# Patient Record
Sex: Female | Born: 1960 | Race: White | Hispanic: No | Marital: Married | Smoking: Never smoker
Health system: Southern US, Community
[De-identification: ages and names within clinical notes are randomized; demographics above are authoritative.]

## PROBLEM LIST (undated history)

## (undated) DIAGNOSIS — M2341 Loose body in knee, right knee: Secondary | ICD-10-CM

## (undated) DIAGNOSIS — R102 Pelvic and perineal pain: Secondary | ICD-10-CM

## (undated) DIAGNOSIS — Z973 Presence of spectacles and contact lenses: Secondary | ICD-10-CM

## (undated) DIAGNOSIS — Z8742 Personal history of other diseases of the female genital tract: Secondary | ICD-10-CM

## (undated) DIAGNOSIS — Q524 Other congenital malformations of vagina: Secondary | ICD-10-CM

## (undated) DIAGNOSIS — M1711 Unilateral primary osteoarthritis, right knee: Secondary | ICD-10-CM

## (undated) DIAGNOSIS — N3941 Urge incontinence: Secondary | ICD-10-CM

## (undated) HISTORY — DX: Other congenital malformations of vagina: Q52.4

## (undated) HISTORY — DX: Pelvic and perineal pain: R10.2

## (undated) HISTORY — PX: TUBAL LIGATION: SHX77

## (undated) HISTORY — PX: COLONOSCOPY: SHX174

## (undated) HISTORY — DX: Urge incontinence: N39.41

## (undated) HISTORY — DX: Personal history of other diseases of the female genital tract: Z87.42

## (undated) HISTORY — PX: CARPAL TUNNEL RELEASE: SHX101

## (undated) HISTORY — PX: LAPAROSCOPY: SHX197

---

## 1997-11-01 ENCOUNTER — Ambulatory Visit (HOSPITAL_COMMUNITY): Admission: RE | Admit: 1997-11-01 | Discharge: 1997-11-01 | Payer: Self-pay | Admitting: Obstetrics and Gynecology

## 1998-10-19 ENCOUNTER — Encounter: Payer: Self-pay | Admitting: Obstetrics and Gynecology

## 1998-10-19 ENCOUNTER — Ambulatory Visit (HOSPITAL_COMMUNITY): Admission: RE | Admit: 1998-10-19 | Discharge: 1998-10-19 | Payer: Self-pay | Admitting: Obstetrics and Gynecology

## 1999-10-31 ENCOUNTER — Encounter: Payer: Self-pay | Admitting: Obstetrics and Gynecology

## 1999-10-31 ENCOUNTER — Ambulatory Visit (HOSPITAL_COMMUNITY): Admission: RE | Admit: 1999-10-31 | Discharge: 1999-10-31 | Payer: Self-pay | Admitting: Obstetrics and Gynecology

## 2000-09-18 ENCOUNTER — Other Ambulatory Visit: Admission: RE | Admit: 2000-09-18 | Discharge: 2000-09-18 | Payer: Self-pay | Admitting: Obstetrics and Gynecology

## 2000-11-01 ENCOUNTER — Encounter: Payer: Self-pay | Admitting: Obstetrics and Gynecology

## 2000-11-01 ENCOUNTER — Ambulatory Visit (HOSPITAL_COMMUNITY): Admission: RE | Admit: 2000-11-01 | Discharge: 2000-11-01 | Payer: Self-pay | Admitting: Obstetrics and Gynecology

## 2001-01-06 ENCOUNTER — Other Ambulatory Visit: Admission: RE | Admit: 2001-01-06 | Discharge: 2001-01-06 | Payer: Self-pay | Admitting: Obstetrics and Gynecology

## 2001-03-13 ENCOUNTER — Encounter: Admission: RE | Admit: 2001-03-13 | Discharge: 2001-03-13 | Payer: Self-pay | Admitting: Emergency Medicine

## 2001-03-13 ENCOUNTER — Encounter: Payer: Self-pay | Admitting: Emergency Medicine

## 2001-09-17 HISTORY — PX: ABDOMINAL HYSTERECTOMY: SHX81

## 2001-11-27 ENCOUNTER — Ambulatory Visit (HOSPITAL_COMMUNITY): Admission: RE | Admit: 2001-11-27 | Discharge: 2001-11-27 | Payer: Self-pay | Admitting: Obstetrics and Gynecology

## 2001-11-27 ENCOUNTER — Encounter: Payer: Self-pay | Admitting: Obstetrics and Gynecology

## 2001-12-02 ENCOUNTER — Encounter: Admission: RE | Admit: 2001-12-02 | Discharge: 2001-12-02 | Payer: Self-pay | Admitting: Obstetrics and Gynecology

## 2001-12-02 ENCOUNTER — Encounter: Payer: Self-pay | Admitting: Obstetrics and Gynecology

## 2001-12-22 ENCOUNTER — Other Ambulatory Visit: Admission: RE | Admit: 2001-12-22 | Discharge: 2001-12-22 | Payer: Self-pay | Admitting: Obstetrics and Gynecology

## 2002-02-03 ENCOUNTER — Encounter (INDEPENDENT_AMBULATORY_CARE_PROVIDER_SITE_OTHER): Payer: Self-pay

## 2002-02-03 ENCOUNTER — Observation Stay (HOSPITAL_COMMUNITY): Admission: RE | Admit: 2002-02-03 | Discharge: 2002-02-04 | Payer: Self-pay | Admitting: Obstetrics and Gynecology

## 2002-05-21 ENCOUNTER — Encounter: Payer: Self-pay | Admitting: Obstetrics and Gynecology

## 2002-05-21 ENCOUNTER — Encounter: Admission: RE | Admit: 2002-05-21 | Discharge: 2002-05-21 | Payer: Self-pay | Admitting: Obstetrics and Gynecology

## 2002-09-17 HISTORY — PX: MELANOMA EXCISION: SHX5266

## 2003-01-06 ENCOUNTER — Encounter: Admission: RE | Admit: 2003-01-06 | Discharge: 2003-01-06 | Payer: Self-pay | Admitting: Obstetrics and Gynecology

## 2003-01-06 ENCOUNTER — Other Ambulatory Visit: Admission: RE | Admit: 2003-01-06 | Discharge: 2003-01-06 | Payer: Self-pay | Admitting: Obstetrics and Gynecology

## 2003-01-06 ENCOUNTER — Encounter: Payer: Self-pay | Admitting: Obstetrics and Gynecology

## 2003-06-15 ENCOUNTER — Encounter (HOSPITAL_BASED_OUTPATIENT_CLINIC_OR_DEPARTMENT_OTHER): Payer: Self-pay | Admitting: General Surgery

## 2003-06-16 ENCOUNTER — Ambulatory Visit (HOSPITAL_COMMUNITY): Admission: RE | Admit: 2003-06-16 | Discharge: 2003-06-16 | Payer: Self-pay | Admitting: General Surgery

## 2003-06-16 ENCOUNTER — Encounter (INDEPENDENT_AMBULATORY_CARE_PROVIDER_SITE_OTHER): Payer: Self-pay | Admitting: *Deleted

## 2004-01-10 ENCOUNTER — Other Ambulatory Visit: Admission: RE | Admit: 2004-01-10 | Discharge: 2004-01-10 | Payer: Self-pay | Admitting: Obstetrics and Gynecology

## 2004-01-10 ENCOUNTER — Encounter: Admission: RE | Admit: 2004-01-10 | Discharge: 2004-01-10 | Payer: Self-pay | Admitting: Obstetrics and Gynecology

## 2004-05-19 ENCOUNTER — Encounter: Admission: RE | Admit: 2004-05-19 | Discharge: 2004-05-19 | Payer: Self-pay | Admitting: Emergency Medicine

## 2004-11-28 ENCOUNTER — Other Ambulatory Visit: Admission: RE | Admit: 2004-11-28 | Discharge: 2004-11-28 | Payer: Self-pay | Admitting: Obstetrics and Gynecology

## 2005-01-10 ENCOUNTER — Ambulatory Visit (HOSPITAL_COMMUNITY): Admission: RE | Admit: 2005-01-10 | Discharge: 2005-01-10 | Payer: Self-pay | Admitting: Obstetrics and Gynecology

## 2005-09-17 HISTORY — PX: OTHER SURGICAL HISTORY: SHX169

## 2006-01-23 ENCOUNTER — Ambulatory Visit (HOSPITAL_COMMUNITY): Admission: RE | Admit: 2006-01-23 | Discharge: 2006-01-23 | Payer: Self-pay | Admitting: Obstetrics and Gynecology

## 2006-05-01 ENCOUNTER — Other Ambulatory Visit: Admission: RE | Admit: 2006-05-01 | Discharge: 2006-05-01 | Payer: Self-pay | Admitting: Obstetrics and Gynecology

## 2007-03-17 ENCOUNTER — Ambulatory Visit (HOSPITAL_COMMUNITY): Admission: RE | Admit: 2007-03-17 | Discharge: 2007-03-17 | Payer: Self-pay | Admitting: Obstetrics and Gynecology

## 2008-02-19 ENCOUNTER — Emergency Department (HOSPITAL_COMMUNITY): Admission: EM | Admit: 2008-02-19 | Discharge: 2008-02-19 | Payer: Self-pay | Admitting: Emergency Medicine

## 2008-05-03 ENCOUNTER — Ambulatory Visit (HOSPITAL_COMMUNITY): Admission: RE | Admit: 2008-05-03 | Discharge: 2008-05-03 | Payer: Self-pay | Admitting: Obstetrics and Gynecology

## 2008-09-17 HISTORY — PX: BREAST CYST ASPIRATION: SHX578

## 2009-05-04 ENCOUNTER — Ambulatory Visit (HOSPITAL_COMMUNITY): Admission: RE | Admit: 2009-05-04 | Discharge: 2009-05-04 | Payer: Self-pay | Admitting: Obstetrics and Gynecology

## 2009-05-09 ENCOUNTER — Encounter: Admission: RE | Admit: 2009-05-09 | Discharge: 2009-05-09 | Payer: Self-pay | Admitting: Obstetrics and Gynecology

## 2009-05-12 ENCOUNTER — Encounter: Admission: RE | Admit: 2009-05-12 | Discharge: 2009-05-12 | Payer: Self-pay | Admitting: Obstetrics and Gynecology

## 2010-07-10 ENCOUNTER — Encounter: Admission: RE | Admit: 2010-07-10 | Discharge: 2010-07-10 | Payer: Self-pay | Admitting: Obstetrics and Gynecology

## 2010-08-12 IMAGING — US US BREAST R
1 series · 4 of 4 positions shown · non-contrast
Comparison: Previous examinations, including the screening
mammogram dated 05/03/2008.

CLINICAL DATA: Possible right breast mass at recent screening
mammography.

DIGITAL DIAGNOSTIC  RIGHT  MAMMOGRAM   AND RIGHT BREAST
ULTRASOUND:

[Series 1: us breast right · 4 of 4 slices shown]
[im 1/4]
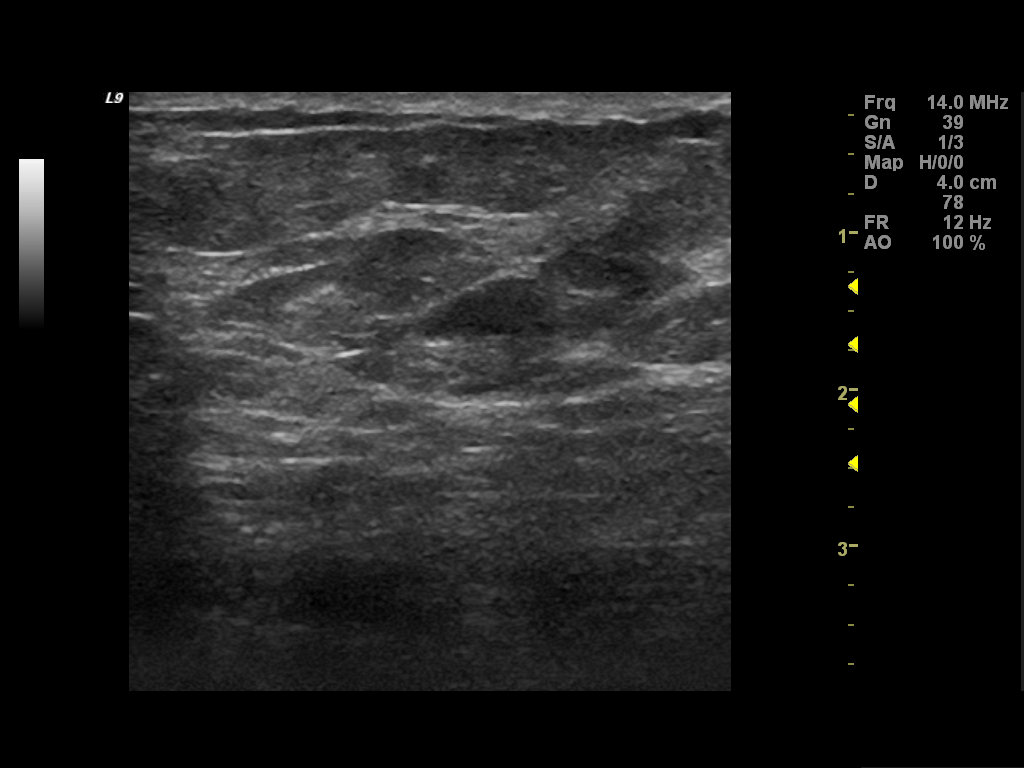
[im 2/4]
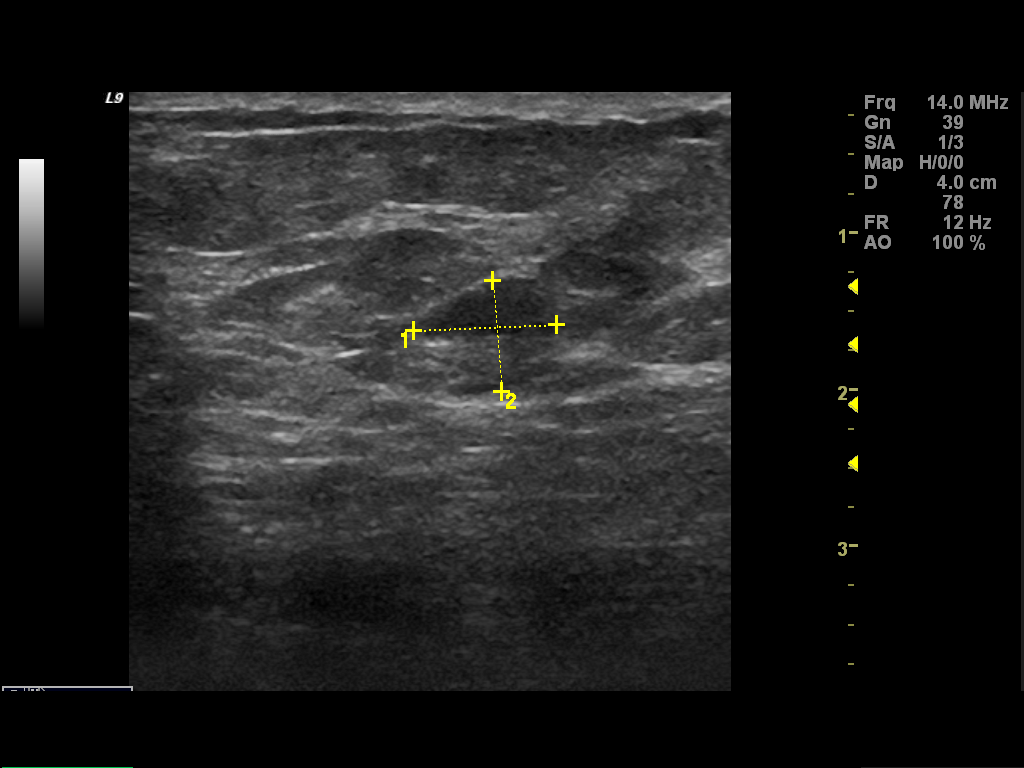
[im 3/4]
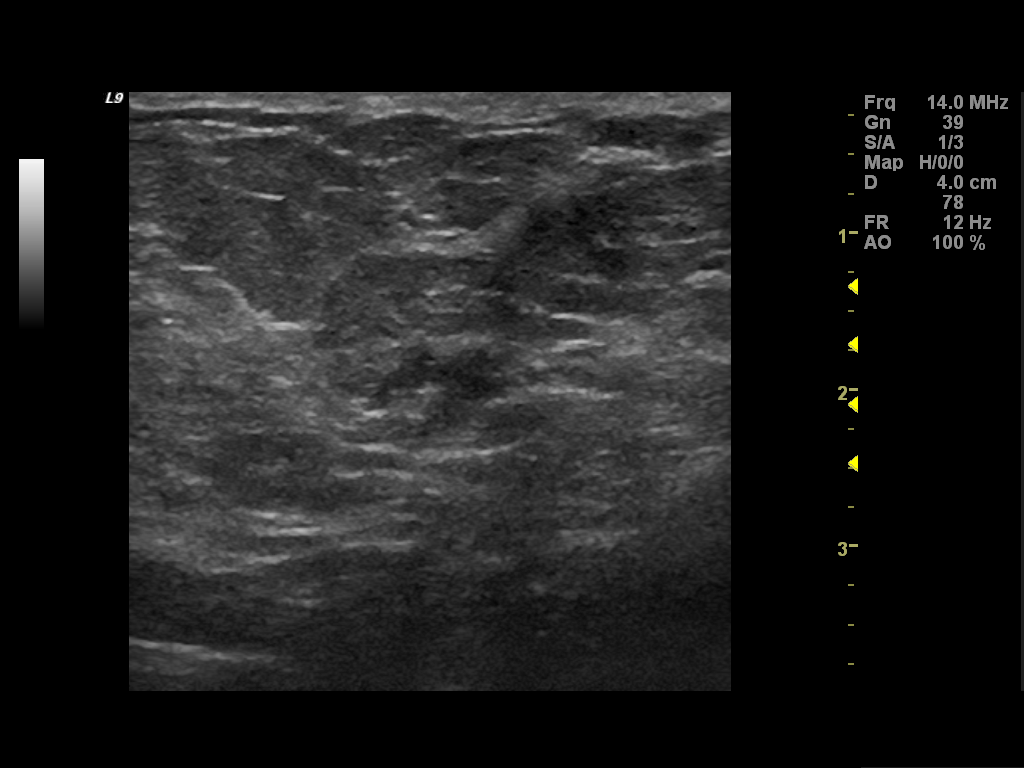
[im 4/4]
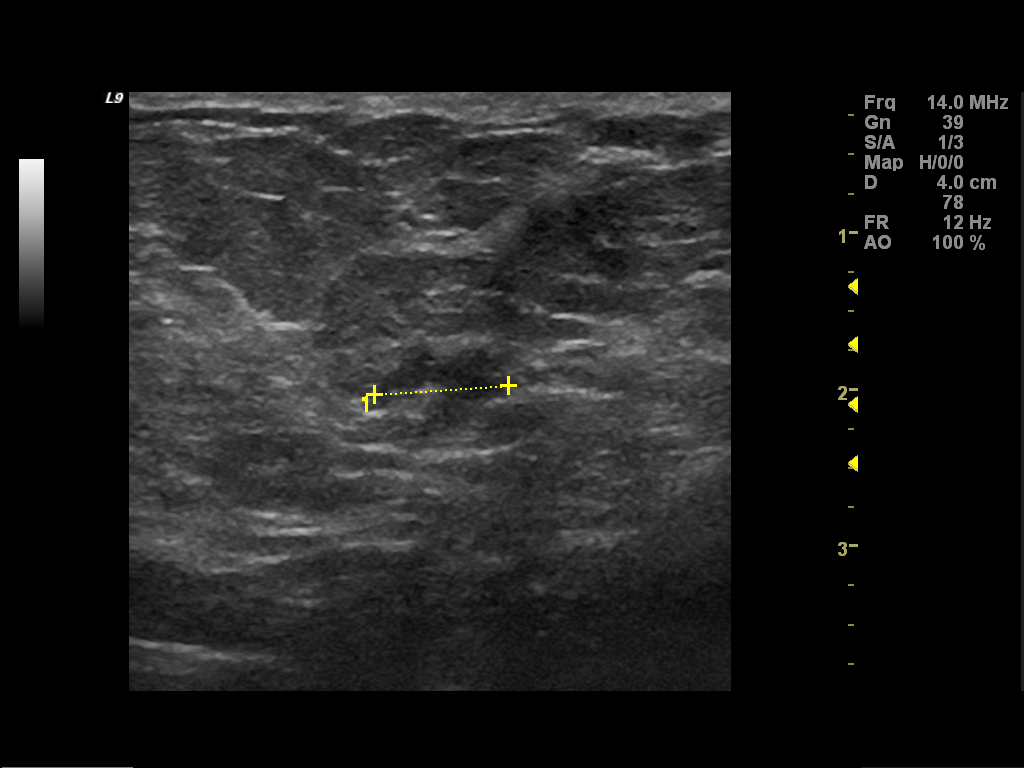

[4 of 4 positions shown; findings below may reference images not displayed]

FINDINGS: Spot compression views of the right breast confirm a
smoothly marginated, macrolobulated mass in the lower inner right
breast.  This is slightly larger than seen on 12/02/2001 and was
shown to have ultrasound features compatible with an intramammary
lymph node on 12/02/2001.

On physical exam, no mass is palpable in the lower inner right
breast.

Ultrasound is performed, showing a 9 x 9 x 7 mm intramammary lymph
node with mild cortical thickening and a normal appearing fatty
hilum in the 5 o'clock position of the right breast, 7 cm from the
nipple.  This measured 8 x 7 mm in maximum dimensions on
12/02/2001.
IMPRESSION: Minimal increase in size of a probably benign right breast
intramammary lymph node.  A follow-up right breast ultrasound is
recommended in 6 months.  The options of ultrasound-guided core
needle biopsy or surgical excisional biopsy were discussed with the
patient but not recommended at this time.  However, she stated that
she would like to have this biopsied under ultrasound guidance.

BI-RADS CATEGORY 3:  Probably benign finding(s) - short interval
follow-up suggested.

## 2010-08-12 IMAGING — MG MM DIGITAL DIAGNOSTIC LIMITED*R*
2 series · 2 of 2 positions shown · non-contrast
Comparison: Previous examinations, including the screening
mammogram dated 05/03/2008.

CLINICAL DATA: Possible right breast mass at recent screening
mammography.

DIGITAL DIAGNOSTIC  RIGHT  MAMMOGRAM   AND RIGHT BREAST
ULTRASOUND:

[R CC]
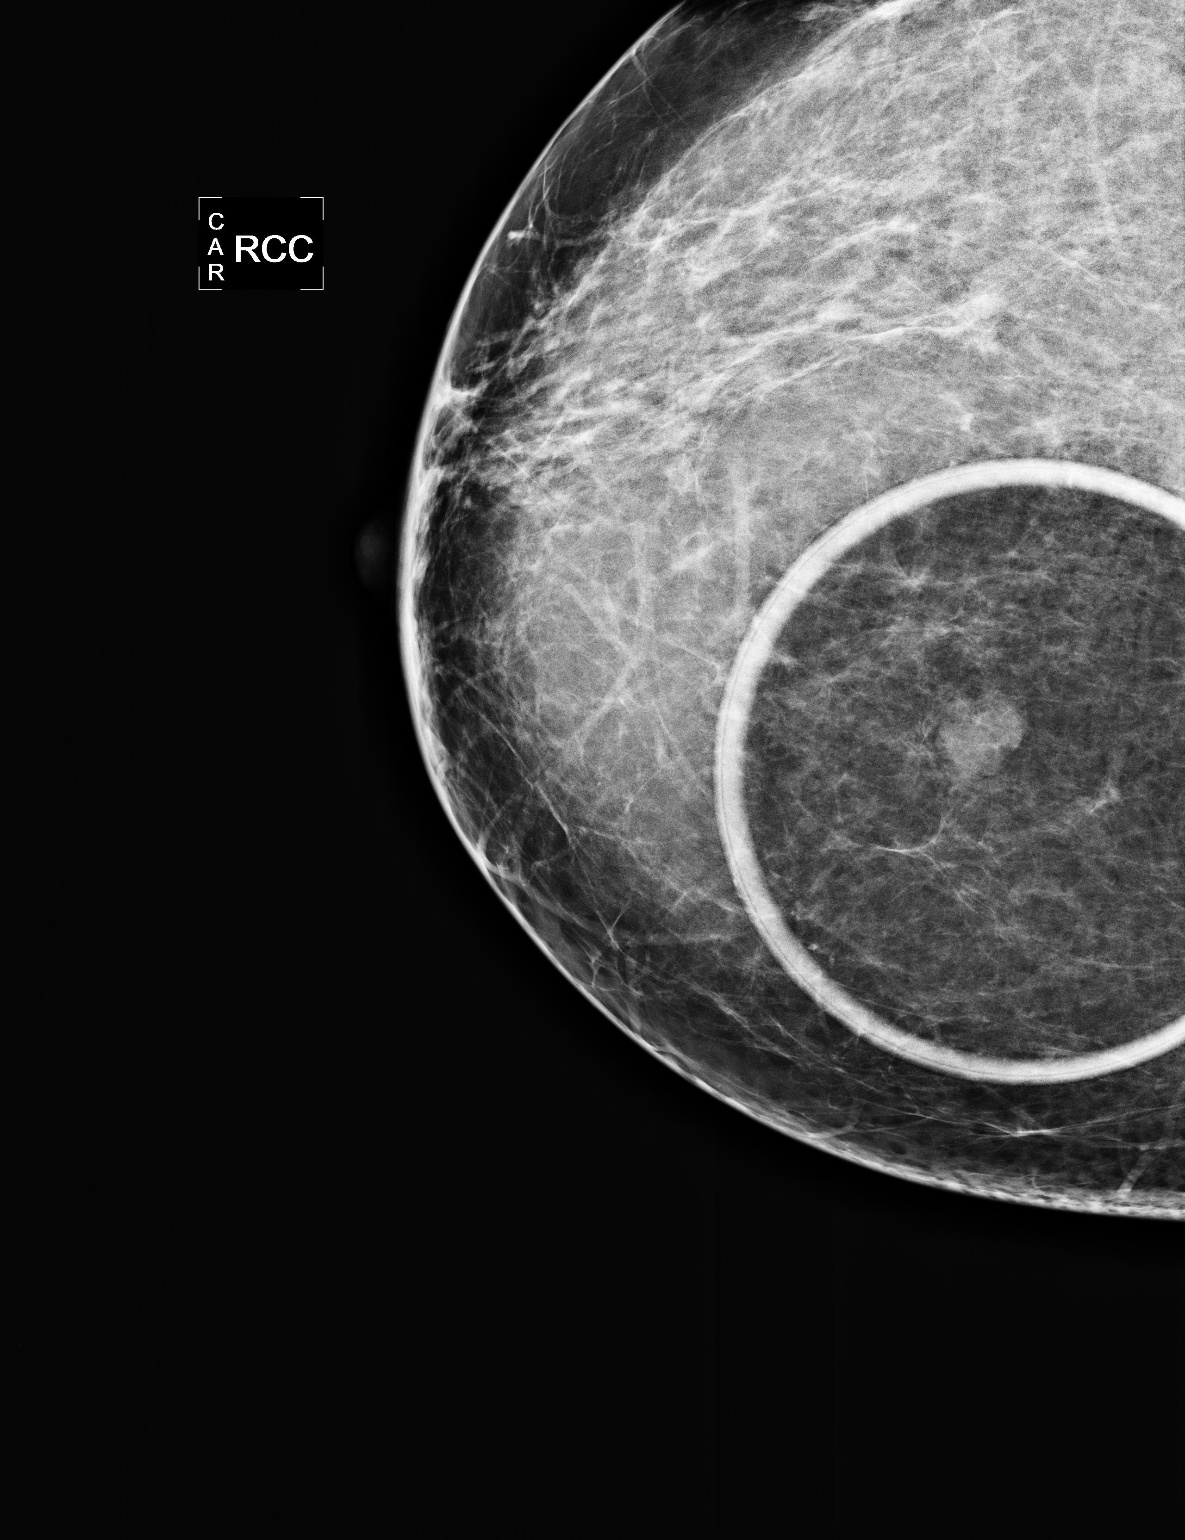

[R MLO]
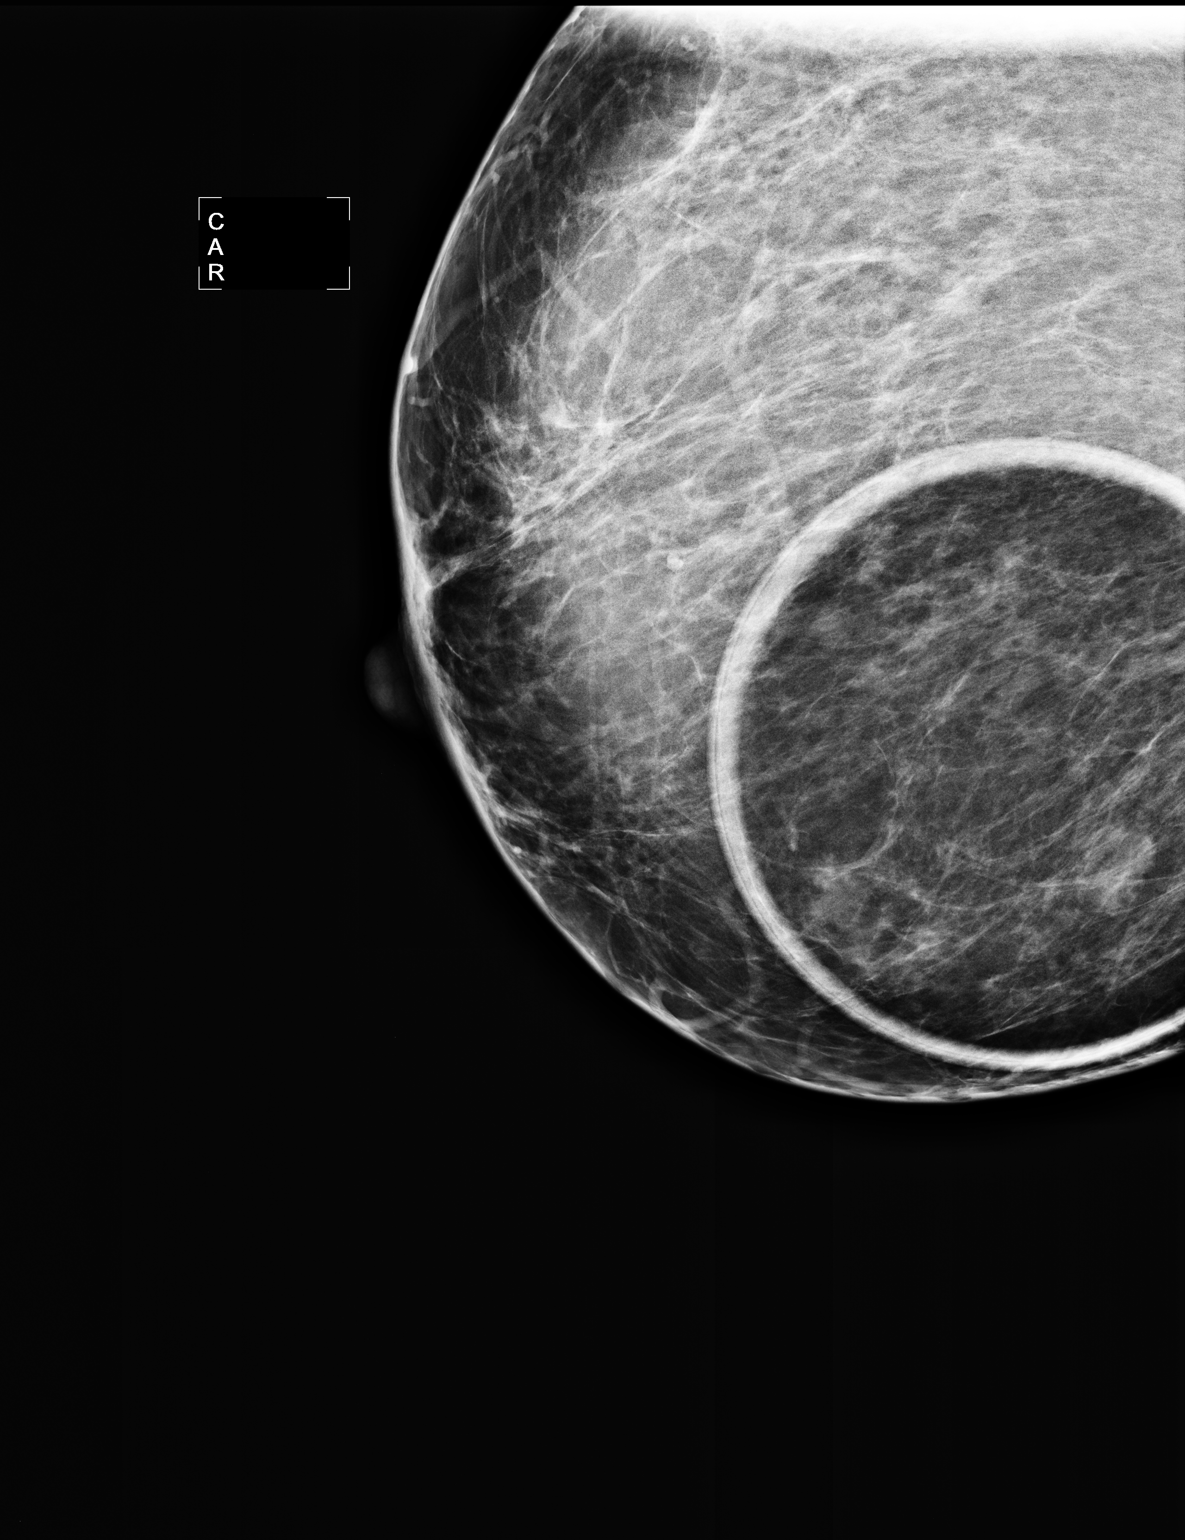

[2 of 2 positions shown; findings below may reference images not displayed]

FINDINGS: Spot compression views of the right breast confirm a
smoothly marginated, macrolobulated mass in the lower inner right
breast.  This is slightly larger than seen on 12/02/2001 and was
shown to have ultrasound features compatible with an intramammary
lymph node on 12/02/2001.

On physical exam, no mass is palpable in the lower inner right
breast.

Ultrasound is performed, showing a 9 x 9 x 7 mm intramammary lymph
node with mild cortical thickening and a normal appearing fatty
hilum in the 5 o'clock position of the right breast, 7 cm from the
nipple.  This measured 8 x 7 mm in maximum dimensions on
12/02/2001.
IMPRESSION: Minimal increase in size of a probably benign right breast
intramammary lymph node.  A follow-up right breast ultrasound is
recommended in 6 months.  The options of ultrasound-guided core
needle biopsy or surgical excisional biopsy were discussed with the
patient but not recommended at this time.  However, she stated that
she would like to have this biopsied under ultrasound guidance.

BI-RADS CATEGORY 3:  Probably benign finding(s) - short interval
follow-up suggested.

## 2011-02-02 NOTE — Op Note (Signed)
[facility]  Patient:    Emma Delgado, Emma Delgado Visit Number: [id number] MRN: [id number]          Service Type: DSU Location: [id number] Attending Physician:  Shaune Spittle Dictated by:   Lindaann Slough, M.D. Proc. Date: 02/03/02 Admit Date:  02/03/2002 Discharge Date: 02/04/2002   CC:         Vanessa P. Pennie Rushing, M.D.   Operative Report  PREOPERATIVE DIAGNOSIS:       Bladder laceration.  POSTOPERATIVE DIAGNOSIS:      Bladder laceration.  OPERATION:                    Repair of bladder laceration with interposition                               of fascia lata graft and cystoscopy.  SURGEON:                      Lindaann Slough, M.D.  ASSISTANT:                    Maris Berger. Pennie Rushing, M.D.  INDICATIONS:                  The patient is a 50 year old female who was undergoing a vaginal hysterectomy.  During the procedure, the bladder was inadvertently entered.  DESCRIPTION OF PROCEDURE:     I was called in consultation by Dr. Pennie Rushing. The patient was found to have a 2 cm transverse laceration at the base of the bladder.  The bladder laceration was then closed in three layers as follows; the bladder mucosa was closed with #2-0 Vicryl using running sutures, and the bladder detrusor was closed in two layers with #2-0 Vicryl.  Then, the Foley catheter that was previously inserted in the bladder was removed.  The cystoscope was then inserted in the bladder.  One ampule of Indigo Carmine was given intravenously.  The bladder closure was watertight.  There was no leak of the irrigant.  The bladder laceration was above the trigone.  The ureteral orifices were well-visualized and each ureteral orifice had blue efflux.  The cystoscope was then removed.  A #18 Foley catheter was then inserted in the bladder.  Then, a piece of fascia lata graft was placed over the area of the repair of the bladder laceration, covering the entire area.  The  fascia lata graft was secured with #2-0 Vicryl.  The rest of the procedure was carried out by Dr. Pennie Rushing. Dictated by:   Lindaann Slough, M.D. Attending Physician:  Shaune Spittle DD:  02/03/02 TD:  02/05/02 Job: [id number]

## 2011-02-02 NOTE — H&P (Signed)
[facility]  Patient:    Emma Delgado, Emma Delgado Visit Number: [id number] MRN: [id number]          Service Type: Attending:  Maris Berger. Pennie Rushing, M.D. Dictated by:   Henreitta Leber, P.A.-C.                           History and Physical  DATE OF BIRTH:                1961-06-15  HISTORY OF PRESENT ILLNESS:   Emma Delgado is a 50 year old married white female, para 2-0-0-2, with a long history of menorrhagia, dysmenorrhea, pelvic pain, and endometriosis.  The patient reports that throughout her life she has had very heavy menstrual periods accompanied by debilitating cramping.  Two years after the birth of her twins in 1997, she had some reprieve from these symptoms, but they quickly resumed their usual heavy and painful pattern.  The patient bleeds for approximately eight days, changing three pads in less than an hour and endures 7/10 menstrual cramps (on a 10 point scale) which are not relieved by nonsteroidal anti-inflammatory medications.  The patient has had to miss work on many occasions and frequently soils her clothes due to the disruptive nature of her flow.  Additionally, she now experiences daily left-sided pain which has not been relieved by over-the-counter analgesia. The patient was placed throughout her years of misery on oral contraceptives, Depot Lupron, and Progesterone, all of which relieved to some extent her symptoms; however, she was intolerant of the side effects associated with each (i.e., severe headaches, nausea, and hot flashes).  She now presents for definitive treatment of her symptoms in the form of a laparoscopic-assisted vaginal hysterectomy.  PAST MEDICAL HISTORY/ OBSTETRIC HISTORY:            Gravida 1, para 2-0-0-2 (1997); cesarean section; patients pregnancy was induced using Serophene for ovulation.  GYNECOLOGIC HISTORY:          1. Menarche 50 years old.  Her menstrual periods    were regular; however, heavy and painful as                                  previously described.                               2. She uses bilateral tubal ligation as a method                                  of contraception.                               3. She denies any history of sexually                                  transmitted diseases.                               4. She does have a history of intermittent  abnormal Pap smears which were followed by                                  simply repeating the Pap smears.                               5. She had a normal Pap smear April 2002.                               6. Her last mammogram was March 2003, which                                  showed normal-appearing intramammary lymph                                  nodes within the lower inner right breast                                  (this will be followed up with a repeat study                                  in six months).  PAST MEDICAL HISTORY:         1. Lung cancer in 1971.                               2. Migraines.                               3. Infertility.                               4. Plantar fasciitis.  PAST SURGICAL HISTORY:        1. Bronchoscopy in 1971.                               2. Laparoscopy in 1996 (for infertility at which                                  time endometriosis was diagnosed).                               3. Bilateral tubal ligation in 1998.                               4. The patient denies any history of blood                                  transfusion.   She does report difficulty  awakening from general anesthesia.  FAMILY HISTORY:               Positive for breast cancer (maternal grandmother age 31), hypertension, diabetes mellitus, asthma, and early-onset arthritis.  SOCIAL HISTORY:               The patient is married, and she works as a Physicist, medical.  HABITS:                       She does not use alcohol or tobacco.  CURRENT MEDICATIONS:          1. Provera 30 mg q.d.                               2. Celebrex 100 mg b.i.d.                               3. Claritin 10 mg q.d.                               4. Glucosamine/ chondroitin 1 tab q.d.  ALLERGIES:                    None.  REVIEW OF SYSTEMS:            Negative except as mentioned in history of present illness.  PHYSICAL EXAMINATION:  VITAL SIGNS:                  Blood pressure 120/80.  Weight is 215.  Height is 5 feet 9 inches tall.  NECK:                         There is no thyromegaly or lymphadenopathy.  HEART:                        Regular rate and rhythm.  There is no murmur.  LUNGS:                        Breath sounds are clear.  There are no wheezes, rales, or rhonchi.  BACK:                         No CVA tenderness.  ABDOMEN:                      Bowel sounds are present.  It is soft, nontender, without any masses or organomegaly.  EXTREMITIES:                  Without clubbing, cyanosis, or edema.  PELVIC:                       EG/BUS is within normal limits.  Vagina is rugose.  The patient does have along the right vaginal wall a Gartners duct cyst.  Cervix is nontender without lesions.  Uterus appears normal size, shape, and consistency without tenderness.  The patient does have tenderness in the left adnexa; however, no palpable masses.  Rectovaginal without tenderness or masses.  IMPRESSION:                   1.  Menorrhagia.                               2. Severe dysmenorrhea.                               3. Pelvic pain.                               4. Endometriosis.  DISPOSITION:                  A discussion was held with the patient regarding the options for management of her symptoms and endometriosis.  She has opted for definitive therapy.  The patient understands the implications for her procedure along with the risks  associated with it to include, but are not limited to, reaction to anesthesia, damage to adjacent organs, bleeding, and  infection.  The patient has consented to undergo a laparoscopic-assisted vaginal hysterectomy at [facility], on Feb 03, 2002, at 9:45 a.m. Dictated by:   Henreitta Leber, P.A.-C. Attending:  Maris Berger. Pennie Rushing, M.D. DD:  01/15/02 TD:  01/15/02 Job: [id number]

## 2011-02-02 NOTE — Op Note (Signed)
   NAMELYDA, COLCORD                    ACCOUNT NO.:  [id number]   MEDICAL RECORD NO.:  [id number]                   PATIENT TYPE:  OIB   LOCATION:  2896                                 FACILITY:  [facility]   PHYSICIAN:  Leonie Man, M.D.                DATE OF BIRTH:  1961/07/07   DATE OF PROCEDURE:  06/16/2003  DATE OF DISCHARGE:                                 OPERATIVE REPORT   PREOPERATIVE DIAGNOSIS:  Melanoma, left upper arm, status post excision.   POSTOPERATIVE DIAGNOSIS:  Melanoma, left upper arm, status post excision.   PROCEDURE:  Wide re-excision of melanoma of right upper arm.   SURGEON:  Leonie Man, M.D.   ASSISTANT:  Nurse.   ANESTHESIA:  General.   The patient is a 50 year old woman with a lesion of the right upper arm,  which she had excised some time ago.  The lesion has recurred and she was  seen by Karie Soda. 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  The patient comes to the operating room now for a  wide re-excision of this area.   DESCRIPTION OF PROCEDURE:  Following the induction of satisfactory general  anesthesia, the patient's left upper arm is prepped and draped to be  included in the sterile operative field.  I designated an approximately 1 cm  margin around the previous excision site and then deepened this incision in  a circumferential fashion down through the skin and subcutaneous tissues,  taking the entire skin lesion and the base of the previous excision down  into the subcutaneous fat of the arm.  This was marked at the 12 o'clock and  the 9 o'clock position with sutures for orientation and forwarded for  pathologic evaluation.  Hemostasis was then obtained with electrocautery.  The wound was then covered with a Vaseline gauze and then a compressive  dressing was placed on the wound, the anesthetic reversed, the patient  removed from the operating room to the recovery room in stable condition.  She tolerated the procedure well.                                               Leonie Man, M.D.    PB/MEDQ  D:  06/16/2003  T:  06/16/2003  Job:  [id number]   cc:   Karie Soda. Joseph Art, M.D.  [address]  [place]  [place] [address]  Fax: [phone]

## 2011-02-02 NOTE — Op Note (Signed)
[facility] of [facility]  Patient:    Emma Delgado, Emma Delgado Visit Number: [id number] MRN: [id number]          Service Type: DSU Location: 9300 [id number] Attending Physician:  Shaune Spittle Dictated by:   Maris Berger Pennie Rushing, M.D. Proc. Date: 02/03/02 Admit Date:  02/03/2002 Discharge Date: 02/04/2002   CC:         Lindaann Slough, M.D.   Operative Report  PREOPERATIVE DIAGNOSES:       1. Menorrhagia.                               2. Severe dysmenorrhea.                               3. Pelvic pain.                               4. Endometriosis.  POSTOPERATIVE DIAGNOSES:      1. Menorrhagia.                               2. Severe dysmenorrhea.                               3. Pelvic pain.                               4. Endometriosis.  OPERATION:                    1. Laparoscopy.                               2. Total vaginal hysterectomy.                               3. Cystotomy.                               4. Cystoscopy.                               5. Drainage of right hydatid of Morgagni.                               6. Cauterization of right tubal implant.                               7. Repair of cystotomy.                               8. Placement of fascia lata.  SURGEON:                      Vanessa P. Pennie Rushing, M.D.  FIRST ASSISTANT:              Henreitta Leber,  P.A.-C.  INTRAOPERATIVE CONSULTANT:    Lindaann Slough, M.D.  ANESTHESIA:                   General orotracheal.  ESTIMATED BLOOD LOSS:         350 cc.  COMPLICATIONS:                Cystotomy.  DESCRIPTION OF PROCEDURE:     The patient was taken to the operating room after appropriate identification and placed on the operating table.  After the obtainment of adequate general anesthesia, she was placed in the modified lithotomy position.  The abdomen, perineum, and vagina were prepped with multiple layers of Betadine, and draped as a sterile field.  A Foley  catheter was inserted into the bladder and connected to straight drainage.  The subumbilical and suprapubic regions were infiltrated with a total of 10 cc of 0.25% Marcaine.  A subumbilical incision was made.  A Veress cannula was placed through the incision into the peritoneal cavity.  A pneumoperitoneum was created with 4 L of CO2.  The Veress cannula was removed and the laparoscopic trocar placed through that incision into the peritoneal cavity. The laparoscope was placed through the trocar sleeve.  Suprapubic incisions were made to the right and left of the midline and laparoscopic probe trocars placed through those incisions into the peritoneal cavity under direct visualization.  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  Once these findings had been made, a decision was made to proceed with total vaginal hysterectomy, and the surgeon and assistant proceeded to place the patient in lithotomy position.  A weighted speculum was placed in the posterior vagina and Lahey tenaculae placed on the anterior and posterior surfaces of the cervix.  The cervicovaginal mucosa was infiltrated with a dilute solution of Pitressin and circumscribed.  The anterior vaginal mucosa was dissected off the anterior cervix and the posterior peritoneum entered sharply and tagged.  The uterosacral ligaments were clamped, cut, and suture ligated on the right and left sides, and those sutures held.  An attempt was made to enter the anterior peritoneum with a combination of sharp and blunt dissection.  However, the bladder was entered and this was recognized by reinsufflating the peritoneal cavity, and visualizing the peritoneal cavity with the laparoscope with a Beaver retractor in the area of  incision which was confirmed to be the bladder.  At that time, urologic consultation was sought, and while the urologist was on route to the operating room, the vaginal hysterectomy was accomplished in the following manner.  The cystotomy having been clearly identified allowed reevaluation of entry into the peritoneum, and actually entry into the peritoneum.  The paracervical, parametrial, and uterine arteries were clamped, cut, and suture ligated.  The uterus was then inverted and the upper pedicles clamped, cut, tied with a free tie, and suture ligated.  The uterus was removed from the operative field.  All sutures had been 0 Vicryl.  A culdoplasty suture was placed in the posterior cul-de-sac, incorporating the uterosacral ligaments and the intervening posterior peritoneum.  This suture was held.  At that point, Dr. Brunilda Payor was present to evaluate the cystotomy, and the decision was made that it could probably be repaired transvaginally.  He proceeded with the repair of the cystotomy and placement of  a fascia lata layer before the peritoneum was closed.  The patient also underwent cystoscopy to document the patency of the ureters and to document a watertight closure of the cystotomy.  This procedure will be dictated under a separate operative report.  Once that had been accomplished, the vaginal cuff was closed first using the sutures which had been held from the uterosacral ligaments and allowing those to create the vaginal angles anteriorly and posteriorly and then tying those down.  The remainder of the vaginal cuff was closed in a single layer, incorporating the anterior vaginal mucosa, the anterior peritoneum, the posterior peritoneum, and the posterior vaginal mucosa in figure-of-eight sutures using 0 Vicryl suture.  The vaginal cuff was completely closed in this fashion and hemostasis noted to be adequate.  Vaginal packing of 2 inch Kling packing with Estrace cream was placed in  the vagina.  The pneumoperitoneum was  then again recreated, and the laparoscope used to document adequate hemostasis, and to cauterize the implant of endometriosis on the right fallopian tubal remnant, and to cauterize the hydatid of Morgagni. Approximately 100 cc of warm lactated Ringers was left in the peritoneal cavity, and all instruments were removed from the peritoneal cavity under direct visualization as the CO2 was allowed to escape.  The subumbilical and suprapubic incisions were closed with subcuticular sutures of 3-0 Vicryl. Sterile dressings were applied.  After completion of the cystoscopy, an 18-French Foley catheter had been placed in the bladder, and remained in place for the patients postoperative period.  All sponges and instruments were counted and were correct.  The patient was awakened from general anesthesia and taken to the recovery room in satisfactory condition having tolerated the procedure well with sponge and instrument counts correct. Dictated by:   Maris Berger. Pennie Rushing, M.D. Attending Physician:  Shaune Spittle DD:  02/03/02 TD:  02/05/02 Job: [id number]

## 2011-06-19 ENCOUNTER — Other Ambulatory Visit: Payer: Self-pay | Admitting: Obstetrics and Gynecology

## 2011-06-19 DIAGNOSIS — Z1231 Encounter for screening mammogram for malignant neoplasm of breast: Secondary | ICD-10-CM

## 2011-07-12 ENCOUNTER — Ambulatory Visit
Admission: RE | Admit: 2011-07-12 | Discharge: 2011-07-12 | Disposition: A | Discharge status: Home / Self Care | Payer: 59 | Source: Ambulatory Visit | Attending: Obstetrics and Gynecology | Admitting: Obstetrics and Gynecology

## 2011-07-12 DIAGNOSIS — Z1231 Encounter for screening mammogram for malignant neoplasm of breast: Secondary | ICD-10-CM

## 2012-01-03 ENCOUNTER — Other Ambulatory Visit: Payer: Self-pay | Admitting: Orthopedic Surgery

## 2012-01-03 DIAGNOSIS — M542 Cervicalgia: Secondary | ICD-10-CM

## 2012-01-09 ENCOUNTER — Ambulatory Visit
Admission: RE | Admit: 2012-01-09 | Discharge: 2012-01-09 | Disposition: A | Discharge status: Home / Self Care | Payer: 59 | Source: Ambulatory Visit | Attending: Orthopedic Surgery | Admitting: Orthopedic Surgery

## 2012-01-09 DIAGNOSIS — M542 Cervicalgia: Secondary | ICD-10-CM

## 2012-06-18 ENCOUNTER — Ambulatory Visit: Payer: Self-pay | Admitting: Obstetrics and Gynecology

## 2012-08-13 ENCOUNTER — Other Ambulatory Visit: Payer: Self-pay | Admitting: Obstetrics and Gynecology

## 2012-08-13 DIAGNOSIS — Z1231 Encounter for screening mammogram for malignant neoplasm of breast: Secondary | ICD-10-CM

## 2012-08-26 ENCOUNTER — Ambulatory Visit
Admission: RE | Admit: 2012-08-26 | Discharge: 2012-08-26 | Disposition: A | Discharge status: Home / Self Care | Payer: 59 | Source: Ambulatory Visit | Attending: Obstetrics and Gynecology | Admitting: Obstetrics and Gynecology

## 2012-08-26 DIAGNOSIS — Z1231 Encounter for screening mammogram for malignant neoplasm of breast: Secondary | ICD-10-CM

## 2012-12-01 ENCOUNTER — Ambulatory Visit: Payer: 59 | Admitting: Obstetrics and Gynecology

## 2012-12-01 ENCOUNTER — Encounter: Payer: Self-pay | Admitting: Obstetrics and Gynecology

## 2012-12-01 VITALS — BP 132/80 | HR 102 | Ht 67.75 in | Wt 261.0 lb

## 2012-12-01 DIAGNOSIS — N809 Endometriosis, unspecified: Secondary | ICD-10-CM

## 2012-12-01 DIAGNOSIS — R1032 Left lower quadrant pain: Secondary | ICD-10-CM | POA: Insufficient documentation

## 2012-12-01 NOTE — Progress Notes (Signed)
Subjective:  Last Pap: 2007, no longer needed WNL: Yes Regular Periods:no Contraception: hysterectomy  Monthly Breast exam:yes Tetanus<78yrs:yes Nl.Bladder Function:yes Daily BMs:yes Healthy Diet:no Calcium:yes Mammogram:yes Date of Mammogram: 08/2012 Exercise:no Have often Exercise: n/a Seatbelt: yes Abuse at home: no Stressful work:yes Sigmoid-colonoscopy: Pt has not had one  Bone Density: No PCP: [facility]  Change in PMH: no changes  Change in ZOX:WRUEAVW with hypothyroidism    Emma Delgado is a 52 y.o. female G1P1 who presents for annual exam.  Has long hx of LLQ pain preveiously attributed to endometriosis.  For the last several months she has had pain which radiates from the LLQ to the LUQ and into her back.  It can last as much as a day.  No nausea, vomiting or change in BM/  No rectal bleding The following portions of the patient's history were reviewed and updated as appropriate: allergies, current medications, past family history, past medical history, past social history, past surgical history and problem list.  Review of Systems Pertinent items are noted in HPI. Gastrointestinal:No change in bowel habits, no abdominal pain, no rectal bleeding Genitourinary:negative for dysuria, frequency, hematuria, nocturia and urinary incontinence    Objective:     BP 132/80  Pulse 102  Ht 5' 7.75" (1.721 m)  Wt 261 lb (118.389 kg)  BMI 39.97 kg/m2  Weight:  Wt Readings from Last 1 Encounters:  12/01/12 261 lb (118.389 kg)     BMI: Body mass index is 39.97 kg/(m^2). General Appearance: Alert, appropriate appearance for age. No acute distress HEENT: Grossly normal Neck / Thyroid: Supple, no masses, nodes or enlargement Lungs: clear to auscultation bilaterally Back: No CVA tenderness Breast Exam: No masses or nodes.No dimpling, nipple retraction or discharge. Cardiovascular: Regular rate and rhythm. S1, S2, no murmur Gastrointestinal: Soft, non-tender, no  masses or organomegaly Pelvic Exam: External genitalia: normal general appearance Vaginal: normal rugae and vaginal vault, Well healed and suspended Rectal: good sphincter tone and no masses Lymphatic Exam: Non-palpable nodes in neck, clavicular, axillary, or inguinal regions Skin: no rash or abnormalities Neurologic: Normal gait and speech, no tremor  Psychiatric: Alert and oriented, appropriate affect.     Assessment:   LLQ, LUQ and back pain without other GI sx Hx endometriosis  Plan:   mammogram Abdominal pelvic CT F/U tba    Dierdre Forth MD

## 2012-12-04 ENCOUNTER — Other Ambulatory Visit: Payer: 59

## 2012-12-05 ENCOUNTER — Ambulatory Visit
Admission: RE | Admit: 2012-12-05 | Discharge: 2012-12-05 | Disposition: A | Discharge status: Home / Self Care | Payer: 59 | Source: Ambulatory Visit | Attending: Obstetrics and Gynecology | Admitting: Obstetrics and Gynecology

## 2012-12-05 DIAGNOSIS — R1032 Left lower quadrant pain: Secondary | ICD-10-CM

## 2012-12-05 MED ORDER — IOHEXOL 300 MG/ML  SOLN
125.0000 mL | Freq: Once | INTRAMUSCULAR | Status: AC | PRN
  Administered 2012-12-05: 125 mL via INTRAVENOUS

## 2013-09-01 ENCOUNTER — Other Ambulatory Visit: Payer: Self-pay

## 2013-09-01 DIAGNOSIS — Z1231 Encounter for screening mammogram for malignant neoplasm of breast: Secondary | ICD-10-CM

## 2013-09-02 ENCOUNTER — Ambulatory Visit
Admission: RE | Admit: 2013-09-02 | Discharge: 2013-09-02 | Disposition: A | Discharge status: Home / Self Care | Payer: 59 | Source: Ambulatory Visit

## 2013-09-02 DIAGNOSIS — Z1231 Encounter for screening mammogram for malignant neoplasm of breast: Secondary | ICD-10-CM

## 2013-09-03 ENCOUNTER — Ambulatory Visit: Payer: 59

## 2014-02-15 ENCOUNTER — Other Ambulatory Visit: Payer: Self-pay | Admitting: Orthopedic Surgery

## 2014-02-15 DIAGNOSIS — M545 Low back pain, unspecified: Secondary | ICD-10-CM

## 2014-02-23 ENCOUNTER — Ambulatory Visit
Admission: RE | Admit: 2014-02-23 | Discharge: 2014-02-23 | Disposition: A | Discharge status: Home / Self Care | Payer: 59 | Source: Ambulatory Visit | Attending: Orthopedic Surgery | Admitting: Orthopedic Surgery

## 2014-02-23 DIAGNOSIS — M545 Low back pain, unspecified: Secondary | ICD-10-CM

## 2014-07-19 ENCOUNTER — Encounter: Payer: Self-pay | Admitting: Obstetrics and Gynecology

## 2014-08-03 ENCOUNTER — Other Ambulatory Visit: Payer: Self-pay

## 2014-08-03 DIAGNOSIS — Z1231 Encounter for screening mammogram for malignant neoplasm of breast: Secondary | ICD-10-CM

## 2014-09-07 ENCOUNTER — Ambulatory Visit
Admission: RE | Admit: 2014-09-07 | Discharge: 2014-09-07 | Disposition: A | Discharge status: Home / Self Care | Payer: 59 | Source: Ambulatory Visit

## 2014-09-07 DIAGNOSIS — Z1231 Encounter for screening mammogram for malignant neoplasm of breast: Secondary | ICD-10-CM

## 2014-09-09 ENCOUNTER — Other Ambulatory Visit: Payer: Self-pay | Admitting: Obstetrics and Gynecology

## 2014-09-09 DIAGNOSIS — R928 Other abnormal and inconclusive findings on diagnostic imaging of breast: Secondary | ICD-10-CM

## 2014-09-23 ENCOUNTER — Ambulatory Visit
Admission: RE | Admit: 2014-09-23 | Discharge: 2014-09-23 | Disposition: A | Discharge status: Home / Self Care | Payer: 59 | Source: Ambulatory Visit | Attending: Obstetrics and Gynecology | Admitting: Obstetrics and Gynecology

## 2014-09-23 DIAGNOSIS — R928 Other abnormal and inconclusive findings on diagnostic imaging of breast: Secondary | ICD-10-CM

## 2014-09-30 ENCOUNTER — Encounter (HOSPITAL_BASED_OUTPATIENT_CLINIC_OR_DEPARTMENT_OTHER)
Admission: RE | Admit: 2014-09-30 | Discharge: 2014-09-30 | Disposition: A | Discharge status: Home / Self Care | Payer: 59 | Source: Ambulatory Visit | Attending: Orthopedic Surgery | Admitting: Orthopedic Surgery

## 2014-09-30 ENCOUNTER — Encounter (HOSPITAL_BASED_OUTPATIENT_CLINIC_OR_DEPARTMENT_OTHER): Payer: Self-pay | Admitting: *Deleted

## 2014-09-30 DIAGNOSIS — Z01810 Encounter for preprocedural cardiovascular examination: Secondary | ICD-10-CM | POA: Insufficient documentation

## 2014-09-30 DIAGNOSIS — M238X1 Other internal derangements of right knee: Secondary | ICD-10-CM | POA: Insufficient documentation

## 2014-09-30 DIAGNOSIS — Z01818 Encounter for other preprocedural examination: Secondary | ICD-10-CM | POA: Diagnosis not present

## 2014-09-30 DIAGNOSIS — Z01812 Encounter for preprocedural laboratory examination: Secondary | ICD-10-CM | POA: Insufficient documentation

## 2014-09-30 NOTE — Progress Notes (Signed)
   09/30/14 1227  OBSTRUCTIVE SLEEP APNEA  Have you ever been diagnosed with sleep apnea through a sleep study? No  Do you snore loudly (loud enough to be heard through closed doors)?  1  Do you often feel tired, fatigued, or sleepy during the daytime? 0  Has anyone observed you stop breathing during your sleep? 0  Do you have, or are you being treated for high blood pressure? 1  BMI more than 35 kg/m2? 1  Age over 53 years old? 1  Gender: 0  Obstructive Sleep Apnea Score 4  Score 4 or greater  Results sent to PCP

## 2014-09-30 NOTE — Progress Notes (Signed)
Will ck on labs pcp-will come in for ekg-bmet if needed

## 2014-10-04 ENCOUNTER — Encounter (HOSPITAL_BASED_OUTPATIENT_CLINIC_OR_DEPARTMENT_OTHER): Payer: Self-pay | Admitting: Physician Assistant

## 2014-10-04 DIAGNOSIS — M1711 Unilateral primary osteoarthritis, right knee: Secondary | ICD-10-CM | POA: Diagnosis present

## 2014-10-04 DIAGNOSIS — M2341 Loose body in knee, right knee: Secondary | ICD-10-CM | POA: Diagnosis present

## 2014-10-04 NOTE — H&P (Signed)
Emma Delgado is an 54 y.o. female.   Chief Complaint: right knee pain and locking HPI: Emma Delgado is a 54 year old seen for evaluation for significant right knee pain with catching locking and popping. She has seen Dr. Charlett Blake for this and had an injection which did not help. We arthroscoped her right knee in 2009 for patellofemoral chondroplasty and loose body excision lateral meniscectomy and lateral release and she was doing well until the past 9 months when she has had significant increased pain. She had a left knee MRI on 04/05/14 that revealed patellofemoral chondromalacia and degenerative joint disease as well as possible loose body in the medial patellofemoral joint and lateral compartment chondromalacia. She has mechanical symptoms with significant pain.   Past Medical History  Diagnosis Date  . Gartner's duct cyst   . Hx of endometriosis   . Pelvic pain   . Urgency incontinence   . H/O: menorrhagia   . Wears glasses   . Primary localized osteoarthritis of right knee   . 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 Knee cap  2007    arthroscopic repair of right medial meniscus and knee cap replacement   . Tubal ligation    . Cesarean section    . Carpal tunnel release      rt  . Melanoma excision  2004    lt upper arm  . Abdominal hysterectomy  2003    cysto  . Laparoscopy    . Colonoscopy      History reviewed. No pertinent family history. Social History:  reports that she has never smoked. She does not have any smokeless tobacco history on file. She reports that she does not drink alcohol or use illicit drugs.  Allergies: No Known Allergies  No current facility-administered medications for this encounter.  Current outpatient prescriptions:  .  topiramate (TOPAMAX) 100 MG tablet, Take 100 mg by mouth every evening., Disp: , Rfl:  .  Aspirin-Acetaminophen-Caffeine (EXCEDRIN PO), Take by mouth., Disp: ,  Rfl:  .  buPROPion (WELLBUTRIN) 100 MG tablet, Take 100 mg by mouth daily., Disp: , Rfl:  .  CALCIUM PO, Take by mouth., Disp: , Rfl:  .  fish oil-omega-3 fatty acids 1000 MG capsule, Take 2 g by mouth daily., Disp: , Rfl:  .  Glucosamine-Chondroitin (GLUCOSAMINE CHONDR COMPLEX PO), Take by mouth., Disp: , Rfl:  .  losartan (COZAAR) 50 MG tablet, Take 50 mg by mouth daily. Pt unsure of dosage, takes 1 pill daily, Disp: , Rfl:  .  Omeprazole (PRILOSEC PO), Take by mouth., Disp: , Rfl:  .  sertraline (ZOLOFT) 100 MG tablet, Take 100 mg by mouth daily., Disp: , Rfl:  No prescriptions prior to admission    No results found for this or any previous visit (from the past 48 hour(s)). No results found.  Review of Systems  Constitutional: Negative.   HENT: Negative.   Eyes: Negative.   Respiratory: Negative.   Cardiovascular: Negative.   Gastrointestinal: Negative.   Genitourinary: Negative.   Musculoskeletal:       Right knee pain and locking  Skin: Negative.   Neurological: Negative.   Endo/Heme/Allergies: Negative.   Psychiatric/Behavioral: Negative.     Height  (1.727 m), weight 117.935 kg (260 lb). Physical Exam  Constitutional: She appears well-developed and well-nourished.  HENT:  Head: Normocephalic and atraumatic.  Mouth/Throat: Oropharynx is clear and moist.  Eyes: Conjunctivae  and EOM are normal. Pupils are equal, round, and reactive to light.  Neck: Neck supple.  Cardiovascular: Normal rate.   Respiratory: Effort normal.  GI: Soft.  Genitourinary:  Not pertinent to current symptomatology therefore not examined.  Musculoskeletal:  Examination of her right knee reveals 2+ crepitation 1+ synovitis pain medially and laterally, range of motion is from 0-120 degrees knee is stable with normal patella tracking. Exam of the left knee reveals full range of motion without pain swelling weakness or instability. Vascular exam: pulses 2+ and symmetric.  Neurological: She is  alert.  Skin: Skin is warm and dry.  Psychiatric: She has a normal mood and affect.     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 Would recommend with these findings and her significant persistent pain that we proceed with right knee arthroscopy with attention to meniscal and chondral pathology and loose body excision. Discussed risks benefits and possible complications of the surgery in detail and she understands this completely.   Rolando Hessling J 10/04/2014, 3:17 PM

## 2014-10-06 ENCOUNTER — Ambulatory Visit (HOSPITAL_BASED_OUTPATIENT_CLINIC_OR_DEPARTMENT_OTHER): Payer: 59 | Admitting: Anesthesiology

## 2014-10-06 ENCOUNTER — Ambulatory Visit (HOSPITAL_BASED_OUTPATIENT_CLINIC_OR_DEPARTMENT_OTHER)
Admission: RE | Admit: 2014-10-06 | Discharge: 2014-10-06 | Disposition: A | Discharge status: Home / Self Care | Payer: 59 | Source: Ambulatory Visit | Attending: Orthopedic Surgery | Admitting: Orthopedic Surgery

## 2014-10-06 ENCOUNTER — Encounter (HOSPITAL_BASED_OUTPATIENT_CLINIC_OR_DEPARTMENT_OTHER): Payer: Self-pay | Admitting: *Deleted

## 2014-10-06 ENCOUNTER — Encounter (HOSPITAL_BASED_OUTPATIENT_CLINIC_OR_DEPARTMENT_OTHER)
Admission: RE | Disposition: A | Discharge status: Home / Self Care | Payer: Self-pay | Source: Ambulatory Visit | Attending: Orthopedic Surgery

## 2014-10-06 DIAGNOSIS — Z7982 Long term (current) use of aspirin: Secondary | ICD-10-CM | POA: Insufficient documentation

## 2014-10-06 DIAGNOSIS — S83242A Other tear of medial meniscus, current injury, left knee, initial encounter: Secondary | ICD-10-CM | POA: Insufficient documentation

## 2014-10-06 DIAGNOSIS — Z79899 Other long term (current) drug therapy: Secondary | ICD-10-CM | POA: Diagnosis not present

## 2014-10-06 DIAGNOSIS — X58XXXA Exposure to other specified factors, initial encounter: Secondary | ICD-10-CM | POA: Insufficient documentation

## 2014-10-06 DIAGNOSIS — S83281A Other tear of lateral meniscus, current injury, right knee, initial encounter: Secondary | ICD-10-CM | POA: Insufficient documentation

## 2014-10-06 DIAGNOSIS — M1711 Unilateral primary osteoarthritis, right knee: Secondary | ICD-10-CM | POA: Insufficient documentation

## 2014-10-06 DIAGNOSIS — Y929 Unspecified place or not applicable: Secondary | ICD-10-CM | POA: Insufficient documentation

## 2014-10-06 DIAGNOSIS — M2341 Loose body in knee, right knee: Secondary | ICD-10-CM | POA: Diagnosis present

## 2014-10-06 DIAGNOSIS — S83241A Other tear of medial meniscus, current injury, right knee, initial encounter: Secondary | ICD-10-CM | POA: Diagnosis present

## 2014-10-06 DIAGNOSIS — M94261 Chondromalacia, right knee: Secondary | ICD-10-CM | POA: Insufficient documentation

## 2014-10-06 HISTORY — PX: KNEE ARTHROSCOPY WITH LATERAL MENISECTOMY: SHX6193

## 2014-10-06 HISTORY — DX: Loose body in knee, right knee: M23.41

## 2014-10-06 HISTORY — DX: Presence of spectacles and contact lenses: Z97.3

## 2014-10-06 HISTORY — PX: KNEE ARTHROSCOPY WITH MEDIAL MENISECTOMY: SHX5651

## 2014-10-06 HISTORY — DX: Unilateral primary osteoarthritis, right knee: M17.11

## 2014-10-06 LAB — POCT HEMOGLOBIN-HEMACUE: HEMOGLOBIN: 13.7 g/dL (ref 12.0–15.0)

## 2014-10-06 SURGERY — ARTHROSCOPY, KNEE, WITH LATERAL MENISCECTOMY
Anesthesia: General | Site: Knee | Laterality: Right

## 2014-10-06 MED ORDER — SCOPOLAMINE 1 MG/3DAYS TD PT72
MEDICATED_PATCH | TRANSDERMAL | Status: AC
Start: 2014-10-06 — End: 2014-10-06
  Filled 2014-10-06: qty 1

## 2014-10-06 MED ORDER — OXYCODONE HCL 5 MG/5ML PO SOLN: 5.0000 mg | Freq: Once | ORAL | Status: DC | PRN

## 2014-10-06 MED ORDER — LIDOCAINE HCL (CARDIAC) 20 MG/ML IV SOLN
INTRAVENOUS | Status: DC | PRN
Start: 2014-10-06 — End: 2014-10-06
  Administered 2014-10-06: 100 mg via INTRAVENOUS

## 2014-10-06 MED ORDER — DEXAMETHASONE SODIUM PHOSPHATE 4 MG/ML IJ SOLN
INTRAMUSCULAR | Status: DC | PRN
  Administered 2014-10-06: 10 mg via INTRAVENOUS

## 2014-10-06 MED ORDER — BUPIVACAINE-EPINEPHRINE (PF) 0.25% -1:200000 IJ SOLN
INTRAMUSCULAR | Status: AC
  Filled 2014-10-06: qty 60

## 2014-10-06 MED ORDER — FENTANYL CITRATE 0.05 MG/ML IJ SOLN
INTRAMUSCULAR | Status: AC
  Filled 2014-10-06: qty 4

## 2014-10-06 MED ORDER — ONDANSETRON HCL 4 MG/2ML IJ SOLN: 4.0000 mg | Freq: Once | INTRAMUSCULAR | Status: DC | PRN

## 2014-10-06 MED ORDER — HYDROMORPHONE HCL 1 MG/ML IJ SOLN: 0.2500 mg | INTRAMUSCULAR | Status: DC | PRN

## 2014-10-06 MED ORDER — OXYCODONE HCL 5 MG PO TABS: 5.0000 mg | ORAL_TABLET | Freq: Once | ORAL | Status: DC | PRN

## 2014-10-06 MED ORDER — SODIUM CHLORIDE 0.9 % IR SOLN
Status: DC | PRN
  Administered 2014-10-06: 6000 mL

## 2014-10-06 MED ORDER — MIDAZOLAM HCL 2 MG/2ML IJ SOLN
INTRAMUSCULAR | Status: AC
  Filled 2014-10-06: qty 2

## 2014-10-06 MED ORDER — MIDAZOLAM HCL 5 MG/5ML IJ SOLN
INTRAMUSCULAR | Status: DC | PRN
  Administered 2014-10-06: 2 mg via INTRAVENOUS

## 2014-10-06 MED ORDER — BUPIVACAINE-EPINEPHRINE (PF) 0.5% -1:200000 IJ SOLN
INTRAMUSCULAR | Status: DC | PRN
Start: 2014-10-06 — End: 2014-10-06
  Administered 2014-10-06: 25 mL

## 2014-10-06 MED ORDER — MELOXICAM 7.5 MG PO TABS: 7.5000 mg | ORAL_TABLET | Freq: Every day | ORAL | Status: AC

## 2014-10-06 MED ORDER — FENTANYL CITRATE 0.05 MG/ML IJ SOLN
INTRAMUSCULAR | Status: DC | PRN
  Administered 2014-10-06: 100 ug via INTRAVENOUS

## 2014-10-06 MED ORDER — EPINEPHRINE HCL 1 MG/ML IJ SOLN
INTRAMUSCULAR | Status: DC | PRN
  Administered 2014-10-06: 1 mg

## 2014-10-06 MED ORDER — FENTANYL CITRATE 0.05 MG/ML IJ SOLN
INTRAMUSCULAR | Status: AC
  Filled 2014-10-06: qty 2

## 2014-10-06 MED ORDER — FENTANYL CITRATE 0.05 MG/ML IJ SOLN
50.0000 ug | INTRAMUSCULAR | Status: DC | PRN
  Administered 2014-10-06: 100 ug via INTRAVENOUS

## 2014-10-06 MED ORDER — EPINEPHRINE HCL 1 MG/ML IJ SOLN
INTRAMUSCULAR | Status: AC
  Filled 2014-10-06: qty 2

## 2014-10-06 MED ORDER — LACTATED RINGERS IV SOLN
INTRAVENOUS | Status: DC
  Administered 2014-10-06 (×2): via INTRAVENOUS

## 2014-10-06 MED ORDER — MIDAZOLAM HCL 2 MG/2ML IJ SOLN
1.0000 mg | INTRAMUSCULAR | Status: DC | PRN
  Administered 2014-10-06: 2 mg via INTRAVENOUS

## 2014-10-06 MED ORDER — CHLORHEXIDINE GLUCONATE 4 % EX LIQD: 60.0000 mL | Freq: Once | CUTANEOUS | Status: DC

## 2014-10-06 MED ORDER — SCOPOLAMINE 1 MG/3DAYS TD PT72
1.0000 | MEDICATED_PATCH | TRANSDERMAL | Status: DC
  Administered 2014-10-06: 1.5 mg via TRANSDERMAL

## 2014-10-06 MED ORDER — OXYCODONE HCL 5 MG PO TABS: ORAL_TABLET | ORAL | Status: AC

## 2014-10-06 MED ORDER — PROPOFOL 10 MG/ML IV BOLUS
INTRAVENOUS | Status: DC | PRN
  Administered 2014-10-06: 250 mg via INTRAVENOUS

## 2014-10-06 MED ORDER — ONDANSETRON HCL 4 MG/2ML IJ SOLN
INTRAMUSCULAR | Status: DC | PRN
  Administered 2014-10-06: 4 mg via INTRAVENOUS

## 2014-10-06 MED ORDER — CEFAZOLIN SODIUM-DEXTROSE 2-3 GM-% IV SOLR
INTRAVENOUS | Status: AC
  Filled 2014-10-06: qty 50

## 2014-10-06 MED ORDER — CEFAZOLIN SODIUM-DEXTROSE 2-3 GM-% IV SOLR
2.0000 g | INTRAVENOUS | Status: AC
  Administered 2014-10-06: 2 g via INTRAVENOUS

## 2014-10-06 SURGICAL SUPPLY — 44 items
BANDAGE ELASTIC 6 VELCRO ST LF (GAUZE/BANDAGES/DRESSINGS) ×2 IMPLANT
BLADE CUTTER GATOR 3.5 (BLADE) ×1 IMPLANT
BLADE GREAT WHITE 4.2 (BLADE) ×1 IMPLANT
BLADE SURG 15 STRL LF DISP TIS (BLADE) IMPLANT
BLADE SURG 15 STRL SS (BLADE)
BNDG COHESIVE 4X5 TAN STRL (GAUZE/BANDAGES/DRESSINGS) IMPLANT
CANISTER SUCT 3000ML (MISCELLANEOUS) IMPLANT
DRAPE ARTHROSCOPY W/POUCH 90 (DRAPES) ×2 IMPLANT
DURAPREP 26ML APPLICATOR (WOUND CARE) ×2 IMPLANT
GAUZE SPONGE 4X4 12PLY STRL (GAUZE/BANDAGES/DRESSINGS) ×2 IMPLANT
GAUZE XEROFORM 1X8 LF (GAUZE/BANDAGES/DRESSINGS) ×2 IMPLANT
GLOVE BIO SURGEON STRL SZ7 (GLOVE) ×2 IMPLANT
GLOVE BIOGEL PI IND STRL 7.0 (GLOVE) ×1 IMPLANT
GLOVE BIOGEL PI IND STRL 7.5 (GLOVE) ×1 IMPLANT
GLOVE BIOGEL PI INDICATOR 7.0 (GLOVE) ×2
GLOVE BIOGEL PI INDICATOR 7.5 (GLOVE) ×1
GLOVE ECLIPSE 6.5 STRL STRAW (GLOVE) ×1 IMPLANT
GLOVE EXAM NITRILE MD LF STRL (GLOVE) ×1 IMPLANT
GLOVE SS BIOGEL STRL SZ 7.5 (GLOVE) ×1 IMPLANT
GLOVE SUPERSENSE BIOGEL SZ 7.5 (GLOVE) ×1
GOWN STRL REUS W/ TWL LRG LVL3 (GOWN DISPOSABLE) ×3 IMPLANT
GOWN STRL REUS W/TWL LRG LVL3 (GOWN DISPOSABLE) ×6
HOLDER KNEE FOAM BLUE (MISCELLANEOUS) ×2 IMPLANT
KNEE WRAP E Z 3 GEL PACK (MISCELLANEOUS) ×2 IMPLANT
MANIFOLD NEPTUNE II (INSTRUMENTS) IMPLANT
NDL SAFETY ECLIPSE 18X1.5 (NEEDLE) ×2 IMPLANT
NEEDLE HYPO 18GX1.5 SHARP (NEEDLE) ×2
NEEDLE HYPO 22GX1.5 SAFETY (NEEDLE) IMPLANT
PACK ARTHROSCOPY DSU (CUSTOM PROCEDURE TRAY) ×2 IMPLANT
PACK BASIN DAY SURGERY FS (CUSTOM PROCEDURE TRAY) ×2 IMPLANT
PAD ALCOHOL SWAB (MISCELLANEOUS) IMPLANT
SET ARTHROSCOPY TUBING (MISCELLANEOUS) ×2
SET ARTHROSCOPY TUBING LN (MISCELLANEOUS) ×1 IMPLANT
SPONGE GAUZE 4X4 12PLY STER LF (GAUZE/BANDAGES/DRESSINGS) ×1 IMPLANT
SUCTION FRAZIER TIP 10 FR DISP (SUCTIONS) IMPLANT
SUT ETHILON 4 0 PS 2 18 (SUTURE) ×2 IMPLANT
SUT PROLENE 3 0 PS 2 (SUTURE) IMPLANT
SUT VIC AB 3-0 PS1 18 (SUTURE)
SUT VIC AB 3-0 PS1 18XBRD (SUTURE) IMPLANT
SYR 20CC LL (SYRINGE) IMPLANT
SYR 5ML LL (SYRINGE) ×2 IMPLANT
TOWEL OR 17X24 6PK STRL BLUE (TOWEL DISPOSABLE) ×2 IMPLANT
WAND STAR VAC 90 (SURGICAL WAND) IMPLANT
WATER STERILE IRR 1000ML POUR (IV SOLUTION) ×2 IMPLANT

## 2014-10-06 NOTE — Interval H&P Note (Signed)
History and Physical Interval Note:  10/06/2014 9:36 AM  Emma Delgado  has presented today for surgery, with the diagnosis of deraingment of posterior horn of medial meniscus due to old tear or injury, right knee, deraignment of other lateral meniscus due to old tear or injury loose body  The various methods of treatment have been discussed with the patient and family. After consideration of risks, benefits and other options for treatment, the patient has consented to  Procedure(s): RIGHT KNEE ARTHROSCOPY WITH MEDIAL AND LATERAL MENISECTOMY AND LOOSE BODY EXCISION (Right) as a surgical intervention .  The patient's history has been reviewed, patient examined, no change in status, stable for surgery.  I have reviewed the patient's chart and labs.  Questions were answered to the patient's satisfaction.     Salvatore MarvelWAINER,Denys Salinger A

## 2014-10-06 NOTE — Anesthesia Preprocedure Evaluation (Signed)
Anesthesia Evaluation  Patient identified by MRN, date of birth, ID band Patient awake    Reviewed: Allergy & Precautions, NPO status , Patient's Chart, lab work & pertinent test results  Airway Mallampati: I  TM Distance: >3 FB Neck ROM: Full    Dental  (+) Poor Dentition, Loose,    Pulmonary  breath sounds clear to auscultation        Cardiovascular hypertension, Pt. on medications Rhythm:Regular Rate:Normal     Neuro/Psych    GI/Hepatic   Endo/Other  Morbid obesity  Renal/GU      Musculoskeletal   Abdominal   Peds  Hematology   Anesthesia Other Findings   Reproductive/Obstetrics                             Anesthesia Physical Anesthesia Plan  ASA: III  Anesthesia Plan: General   Post-op Pain Management:    Induction: Intravenous  Airway Management Planned: LMA  Additional Equipment:   Intra-op Plan:   Post-operative Plan: Extubation in OR  Informed Consent: I have reviewed the patients History and Physical, chart, labs and discussed the procedure including the risks, benefits and alternatives for the proposed anesthesia with the patient or authorized representative who has indicated his/her understanding and acceptance.   Dental advisory given  Plan Discussed with: CRNA, Anesthesiologist and Surgeon  Anesthesia Plan Comments:         Anesthesia Quick Evaluation

## 2014-10-06 NOTE — Anesthesia Postprocedure Evaluation (Signed)
  Anesthesia Post-op Note  Patient: Emma Delgado  Procedure(s) Performed: Procedure(s): RIGHT KNEE ARTHROSCOPY WITH MEDIAL AND LATERAL MENISECTOMY CHONDROPLASTY (Right)  Patient Location: PACU  Anesthesia Type: General   Level of Consciousness: awake, alert  and oriented  Airway and Oxygen Therapy: Patient Spontanous Breathing  Post-op Pain: none  Post-op Assessment: Post-op Vital signs reviewed  Post-op Vital Signs: Reviewed  Last Vitals:  Filed Vitals:   10/06/14 1127  BP: 122/61  Pulse: 86  Temp: 36.6 C  Resp: 16    Complications: No apparent anesthesia complications

## 2014-10-06 NOTE — Progress Notes (Signed)
Assisted Dr. Crews with right, knee block. Side rails up, monitors on throughout procedure. See vital signs in flow sheet. Tolerated Procedure well. 

## 2014-10-06 NOTE — Transfer of Care (Signed)
Immediate Anesthesia Transfer of Care Note  Patient: Emma Delgado  Procedure(s) Performed: Procedure(s): RIGHT KNEE ARTHROSCOPY WITH MEDIAL AND LATERAL MENISECTOMY CHONDROPLASTY (Right)  Patient Location: PACU  Anesthesia Type:GA combined with regional for post-op pain  Level of Consciousness: awake, sedated and patient cooperative  Airway & Oxygen Therapy: Patient Spontanous Breathing and Patient connected to face mask oxygen  Post-op Assessment: Report given to PACU RN and Post -op Vital signs reviewed and stable  Post vital signs: Reviewed and stable  Complications: No apparent anesthesia complications

## 2014-10-06 NOTE — Discharge Instructions (Signed)
°  Post Anesthesia Home Care Instructions ° °Activity: °Get plenty of rest for the remainder of the day. A responsible adult should stay with you for 24 hours following the procedure.  °For the next 24 hours, DO NOT: °-Drive a car °-Operate machinery °-Drink alcoholic beverages °-Take any medication unless instructed by your physician °-Make any legal decisions or sign important papers. ° °Meals: °Start with liquid foods such as gelatin or soup. Progress to regular foods as tolerated. Avoid greasy, spicy, heavy foods. If nausea and/or vomiting occur, drink only clear liquids until the nausea and/or vomiting subsides. Call your physician if vomiting continues. ° °Special Instructions/Symptoms: °Your throat may feel dry or sore from the anesthesia or the breathing tube placed in your throat during surgery. If this causes discomfort, gargle with warm salt water. The discomfort should disappear within 24 hours. ° °Regional Anesthesia Blocks ° °1. Numbness or the inability to move the "blocked" extremity may last from 3-48 hours after placement. The length of time depends on the medication injected and your individual response to the medication. If the numbness is not going away after 48 hours, call your surgeon. ° °2. The extremity that is blocked will need to be protected until the numbness is gone and the  Strength has returned. Because you cannot feel it, you will need to take extra care to avoid injury. Because it may be weak, you may have difficulty moving it or using it. You may not know what position it is in without looking at it while the block is in effect. ° °3. For blocks in the legs and feet, returning to weight bearing and walking needs to be done carefully. You will need to wait until the numbness is entirely gone and the strength has returned. You should be able to move your leg and foot normally before you try and bear weight or walk. You will need someone to be with you when you first try to ensure you  do not fall and possibly risk injury. ° °4. Bruising and tenderness at the needle site are common side effects and will resolve in a few days. ° °5. Persistent numbness or new problems with movement should be communicated to the surgeon or the [facility] ([phone])/ [facility] ([phone]). ° °Call your surgeon if you experience:  ° °1.  Fever over 101.0. °2.  Inability to urinate. °3.  Nausea and/or vomiting. °4.  Extreme swelling or bruising at the surgical site. °5.  Continued bleeding from the incision. °6.  Increased pain, redness or drainage from the incision. °7.  Problems related to your pain medication. °8. Any change in color, movement and/or sensation °9. Any problems and/or concerns ° °

## 2014-10-06 NOTE — Anesthesia Procedure Notes (Addendum)
Procedure Name: LMA Insertion Date/Time: 10/06/2014 9:41 AM Performed by: Gar GibbonKEETON, DENNIS S Pre-anesthesia Checklist: Patient identified, Emergency Drugs available, Suction available and Patient being monitored Patient Re-evaluated:Patient Re-evaluated prior to inductionOxygen Delivery Method: Circle System Utilized Preoxygenation: Pre-oxygenation with 100% oxygen Intubation Type: IV induction Ventilation: Mask ventilation without difficulty LMA: LMA inserted LMA Size: 4.0 Number of attempts: 1 Airway Equipment and Method: Bite block Placement Confirmation: positive ETCO2 Tube secured with: Tape Dental Injury: Teeth and Oropharynx as per pre-operative assessment    Anesthesia Procedure Note At the request of Dr. Thurston HoleWainer, a Right Knee injection was performed.  After sterile prep with Chlorhexidine, a 25g needle was used to infiltrate the two lower ports of the planned knee arthroscopy with 5ml each of 0.5% Marcaine with 1:200000 Epi. Then, an 18g needle was used to place 15 ml through port into the joint through the portals described above.  Conscious sedation was provided. He tolerated the procedure well. Time of procedure was charted by the pre op nurse, Coolidge Breezeana Smith, RN.

## 2014-10-07 ENCOUNTER — Encounter (HOSPITAL_BASED_OUTPATIENT_CLINIC_OR_DEPARTMENT_OTHER): Payer: Self-pay | Admitting: Orthopedic Surgery

## 2014-10-08 NOTE — Op Note (Signed)
NAME:  Emma Delgado, MIONE NO.:  MEDICAL RECORD NO.:  [id number]  LOCATION:                                 FACILITY:  PHYSICIAN:  Antrell Tipler A. Thurston Hole, M.D.      DATE OF BIRTH:  DATE OF PROCEDURE:  10/06/2014 DATE OF DISCHARGE:                              OPERATIVE REPORT   PREOPERATIVE DIAGNOSES: 1. Right knee necrotic, nontraumatic, medial and lateral meniscal     tears. 2. Right knee primary localized osteoarthritis/grade 3 chondromalacia     medial and lateral compartments, and grade 4 chondromalacia     patellofemoral joint.  POSTOPERATIVE DIAGNOSES: 1. Right knee necrotic, nontraumatic, medial and lateral meniscal     tears. 2. Right knee primary localized osteoarthritis/grade 3 chondromalacia     medial and lateral compartments, and grade 4 chondromalacia     patellofemoral joint.  PROCEDURE: 1. Right knee EUA, followed by arthroscopic partial medial and lateral     meniscectomies. 2. Right knee tricompartmental chondroplasty.  SURGEON:  Elana Alm. Thurston Hole, M.D.  ASSISTANT:  Julien Girt, PA.  ANESTHESIA:  General.  OPERATIVE TIME:  40 minutes.  COMPLICATIONS:  None.  INDICATION FOR PROCEDURE:  [name] is a 54 year old woman who has had 9-10 months of increasing right knee pain with exam and MRI documenting meniscal tearing, possible loose bodies with chondromalacia. She has failed conservative care and is now to undergo arthroscopy.  DESCRIPTION OF PROCEDURE:  Ms. Blecha was brought to the operating room on October 06, 2014, after a knee block was placed in the holding room by Anesthesia.  She was placed on operative table in supine position.  She received antibiotics preoperatively for prophylaxis. After being placed under general anesthesia, her right knee was examined.  She had full range of motion.  Knee was stable.  Ligamentous exam with normal patellar tracking.  The right leg was prepped using sterile DuraPrep  and draped using sterile technique.  Time-out procedure was called and the correct right knee identified.  Initially, through an anterolateral portal, the arthroscope with a pump attached was placed into an anteromedial portal, an arthroscopic probe was placed.  On initial inspection of the medial compartment, she was found to have 75% grade 3 chondromalacia, which was debrided.  Medial meniscus showed tearing of the posterior medial horn of which 30-40% was resected back to a stable rim.  Intercondylar notch inspected.  Anterior and posterior cruciate ligaments were normal.  Lateral compartment inspected.  She had 60-70% grade 3 chondromalacia, which was debrided.  Lateral meniscus showed tearing of the anterior, lateral, and posterior horn of which 30- 40% was resected back to a stable rim.  Patellofemoral joint showed complete grade 4 changes on her patella and 50% grade 3 changes in the femoral groove which was debrided.  The patella tracked normally. Medial and lateral gutters were free of pathology.  After this was done, it was felt that all pathology had been satisfactorily addressed.  The instruments were removed.  Portals closed with 3-0 nylon suture. Sterile dressings were applied.  Sterile dressings were applied.  The patient awakened and taken to recovery in stable condition.  FOLLOWUP CARE:  Ms. Ivar BuryHallyburton will be followed as an outpatient on Norco and Mobic.  She will be back in office in a week for sutures out and followup.     Shamell Suarez A. Thurston HoleWainer, M.D.     RAW/MEDQ  D:  10/06/2014  T:  10/06/2014  Job:  [id number]

## 2014-11-05 ENCOUNTER — Encounter (HOSPITAL_BASED_OUTPATIENT_CLINIC_OR_DEPARTMENT_OTHER): Payer: Self-pay | Admitting: Orthopedic Surgery

## 2015-10-06 ENCOUNTER — Other Ambulatory Visit: Payer: Self-pay

## 2015-10-06 DIAGNOSIS — Z1231 Encounter for screening mammogram for malignant neoplasm of breast: Secondary | ICD-10-CM

## 2015-10-25 ENCOUNTER — Other Ambulatory Visit: Payer: Self-pay

## 2015-10-25 ENCOUNTER — Ambulatory Visit
Admission: RE | Admit: 2015-10-25 | Discharge: 2015-10-25 | Disposition: A | Discharge status: Home / Self Care | Payer: 59 | Source: Ambulatory Visit

## 2015-10-25 DIAGNOSIS — Z1231 Encounter for screening mammogram for malignant neoplasm of breast: Secondary | ICD-10-CM

## 2016-10-18 ENCOUNTER — Other Ambulatory Visit: Payer: Self-pay | Admitting: Obstetrics and Gynecology

## 2016-10-18 DIAGNOSIS — Z1231 Encounter for screening mammogram for malignant neoplasm of breast: Secondary | ICD-10-CM

## 2016-11-16 ENCOUNTER — Ambulatory Visit
Admission: RE | Admit: 2016-11-16 | Discharge: 2016-11-16 | Disposition: A | Discharge status: Home / Self Care | Payer: 59 | Source: Ambulatory Visit | Attending: Obstetrics and Gynecology | Admitting: Obstetrics and Gynecology

## 2016-11-16 DIAGNOSIS — Z1231 Encounter for screening mammogram for malignant neoplasm of breast: Secondary | ICD-10-CM

## 2017-10-09 ENCOUNTER — Other Ambulatory Visit: Payer: Self-pay | Admitting: Obstetrics and Gynecology

## 2017-10-09 DIAGNOSIS — Z1231 Encounter for screening mammogram for malignant neoplasm of breast: Secondary | ICD-10-CM

## 2017-11-18 ENCOUNTER — Ambulatory Visit
Admission: RE | Admit: 2017-11-18 | Discharge: 2017-11-18 | Disposition: A | Discharge status: Home / Self Care | Payer: 59 | Source: Ambulatory Visit | Attending: Obstetrics and Gynecology | Admitting: Obstetrics and Gynecology

## 2017-11-18 DIAGNOSIS — Z1231 Encounter for screening mammogram for malignant neoplasm of breast: Secondary | ICD-10-CM

## 2018-10-15 ENCOUNTER — Other Ambulatory Visit: Payer: Self-pay | Admitting: Obstetrics and Gynecology

## 2018-10-15 DIAGNOSIS — Z1231 Encounter for screening mammogram for malignant neoplasm of breast: Secondary | ICD-10-CM

## 2018-11-21 ENCOUNTER — Ambulatory Visit
Admission: RE | Admit: 2018-11-21 | Discharge: 2018-11-21 | Disposition: A | Discharge status: Home / Self Care | Payer: 59 | Source: Ambulatory Visit | Attending: Obstetrics and Gynecology | Admitting: Obstetrics and Gynecology

## 2018-11-21 DIAGNOSIS — Z1231 Encounter for screening mammogram for malignant neoplasm of breast: Secondary | ICD-10-CM

## 2019-10-16 ENCOUNTER — Other Ambulatory Visit: Payer: Self-pay | Admitting: Obstetrics & Gynecology

## 2019-10-16 DIAGNOSIS — Z1231 Encounter for screening mammogram for malignant neoplasm of breast: Secondary | ICD-10-CM

## 2019-11-23 ENCOUNTER — Other Ambulatory Visit: Payer: Self-pay

## 2019-11-23 ENCOUNTER — Ambulatory Visit
Admission: RE | Admit: 2019-11-23 | Discharge: 2019-11-23 | Disposition: A | Discharge status: Home / Self Care | Payer: 59 | Source: Ambulatory Visit | Attending: Obstetrics & Gynecology | Admitting: Obstetrics & Gynecology

## 2019-11-23 DIAGNOSIS — Z1231 Encounter for screening mammogram for malignant neoplasm of breast: Secondary | ICD-10-CM

## 2020-10-17 ENCOUNTER — Other Ambulatory Visit: Payer: Self-pay | Admitting: Obstetrics & Gynecology

## 2020-10-17 DIAGNOSIS — Z1231 Encounter for screening mammogram for malignant neoplasm of breast: Secondary | ICD-10-CM

## 2020-11-25 ENCOUNTER — Other Ambulatory Visit: Payer: Self-pay | Admitting: Nephrology

## 2020-11-25 ENCOUNTER — Ambulatory Visit
Admission: RE | Admit: 2020-11-25 | Discharge: 2020-11-25 | Disposition: A | Discharge status: Home / Self Care | Payer: 59 | Source: Ambulatory Visit | Attending: Nephrology | Admitting: Nephrology

## 2020-11-25 DIAGNOSIS — N183 Chronic kidney disease, stage 3 unspecified: Secondary | ICD-10-CM

## 2020-11-30 ENCOUNTER — Ambulatory Visit
Admission: RE | Admit: 2020-11-30 | Discharge: 2020-11-30 | Disposition: A | Discharge status: Home / Self Care | Payer: 59 | Source: Ambulatory Visit | Attending: Obstetrics & Gynecology | Admitting: Obstetrics & Gynecology

## 2020-11-30 ENCOUNTER — Other Ambulatory Visit: Payer: Self-pay

## 2020-11-30 DIAGNOSIS — Z1231 Encounter for screening mammogram for malignant neoplasm of breast: Secondary | ICD-10-CM

## 2021-02-25 IMAGING — MG DIGITAL SCREENING BILAT W/ TOMO W/ CAD
6 of 10 series · 6 of 30 positions shown · non-contrast
Comparison: Previous exam(s).

CLINICAL DATA: Screening.

EXAM:
DIGITAL SCREENING BILATERAL MAMMOGRAM WITH TOMO AND CAD

[L MLO synth-2D]
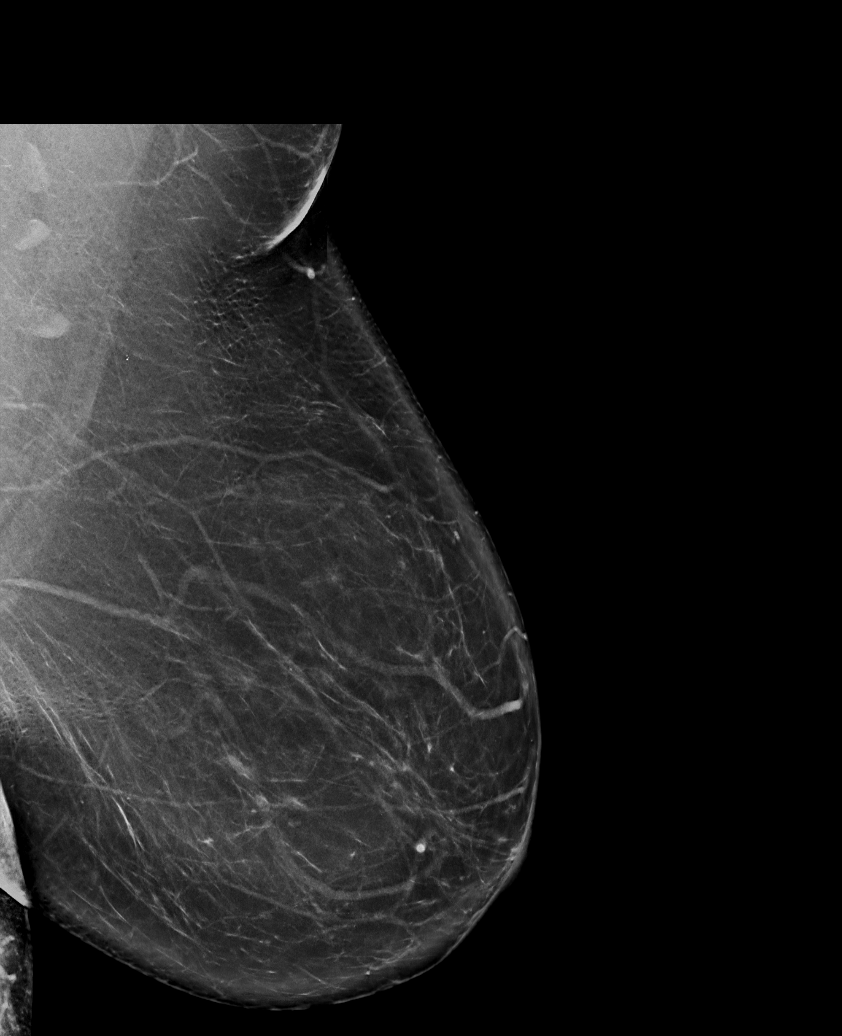

[R CC synth-2D]
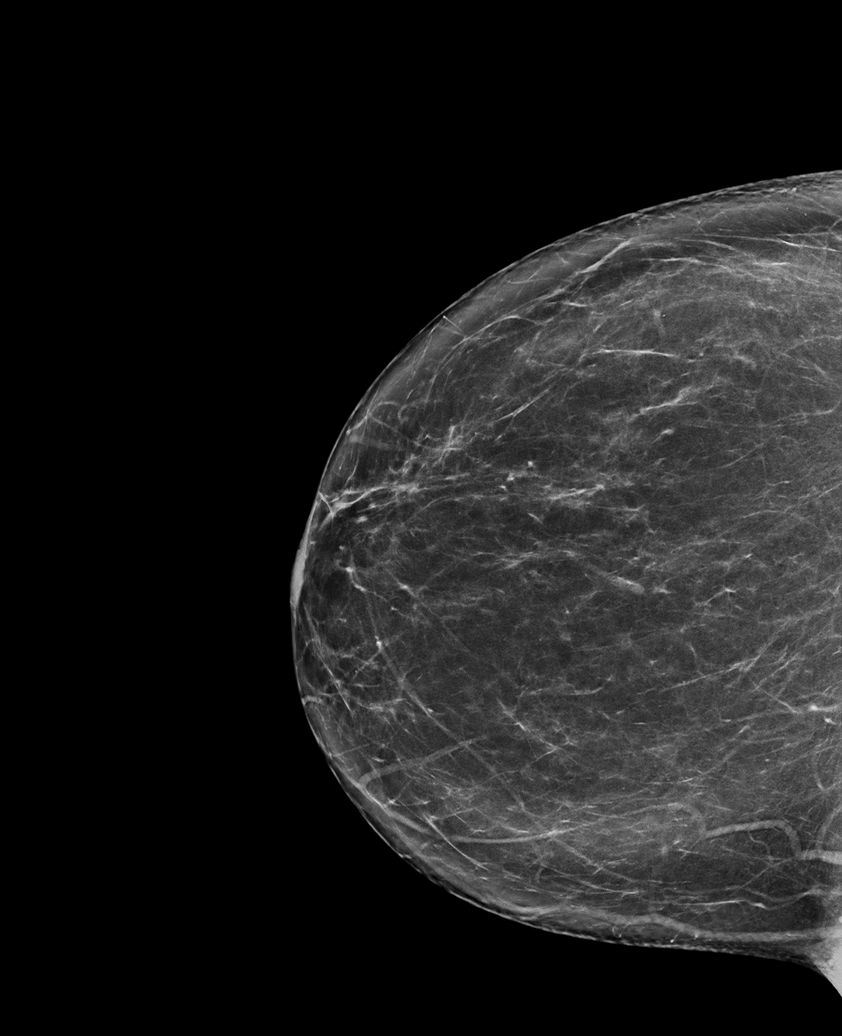

[R MLO synth-2D (1 of 2)]
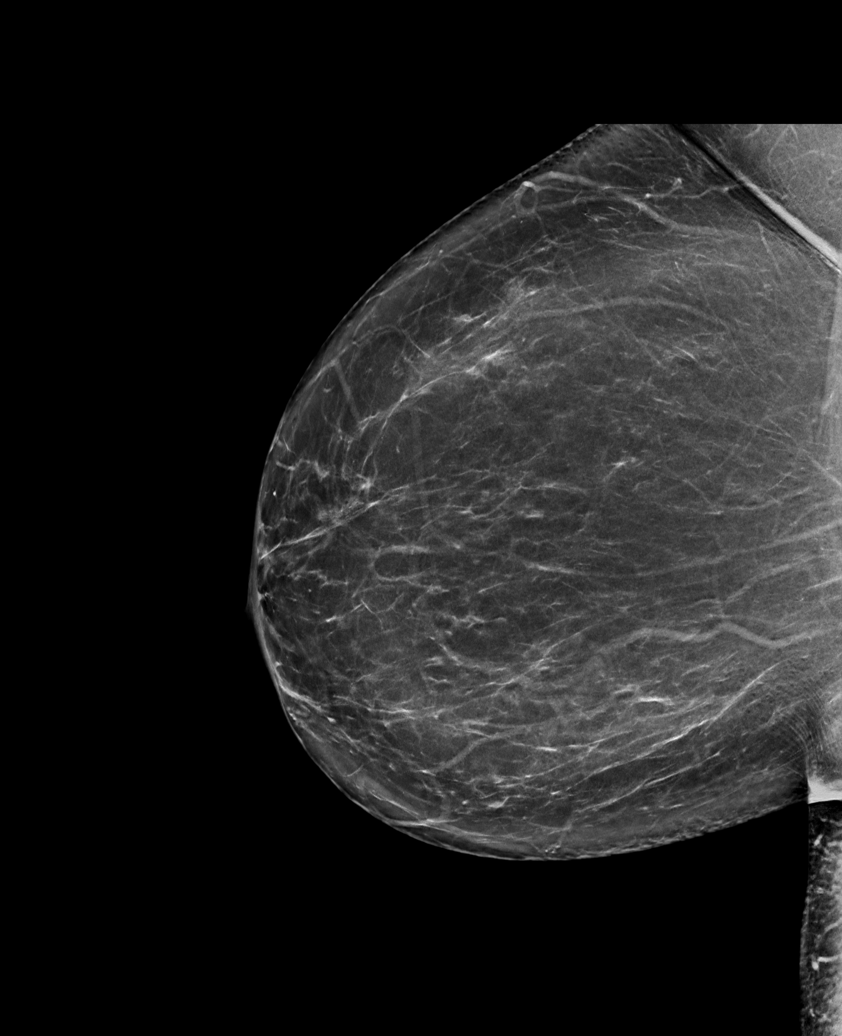

[R MLO synth-2D (2 of 2)]
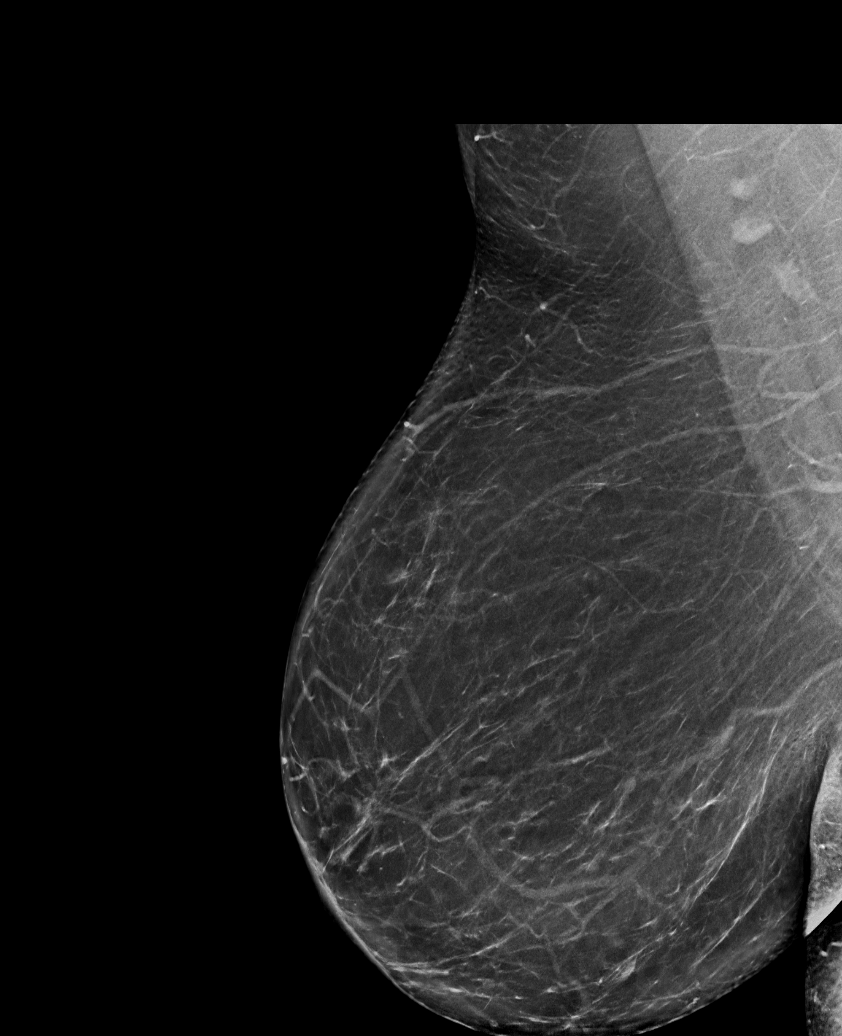

[L CC synth-2D]
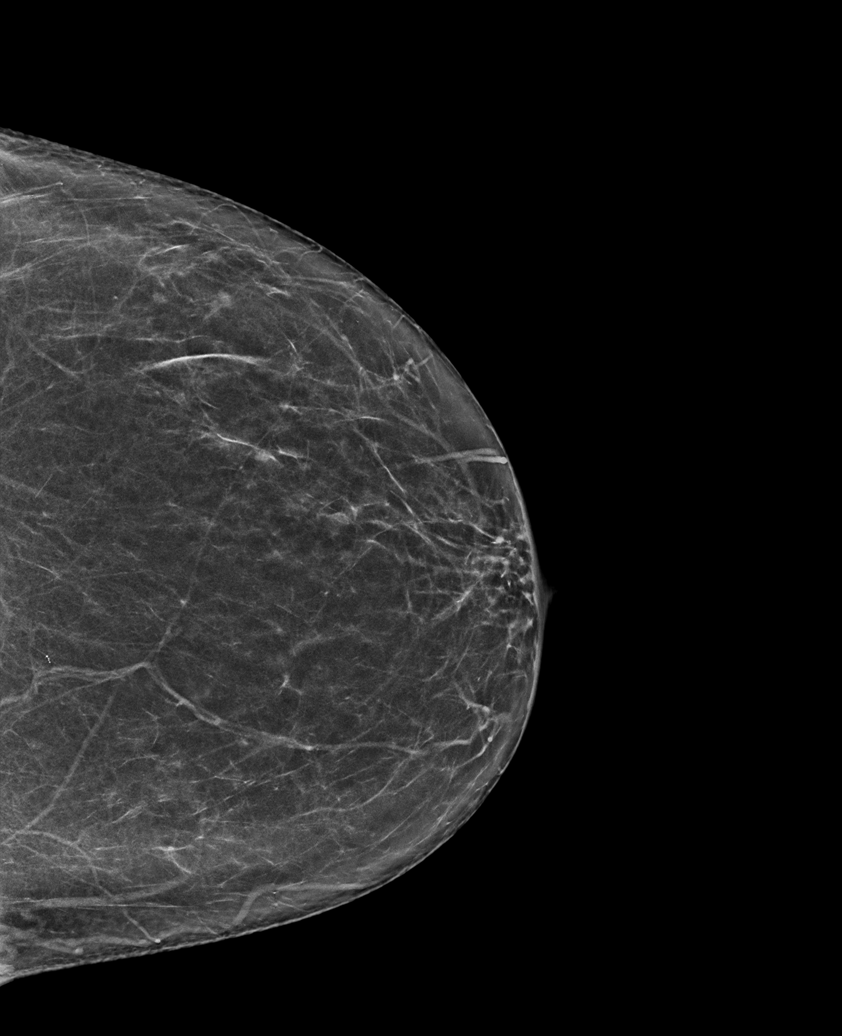

[R MLO tomo · tomo slice 43/86.0]
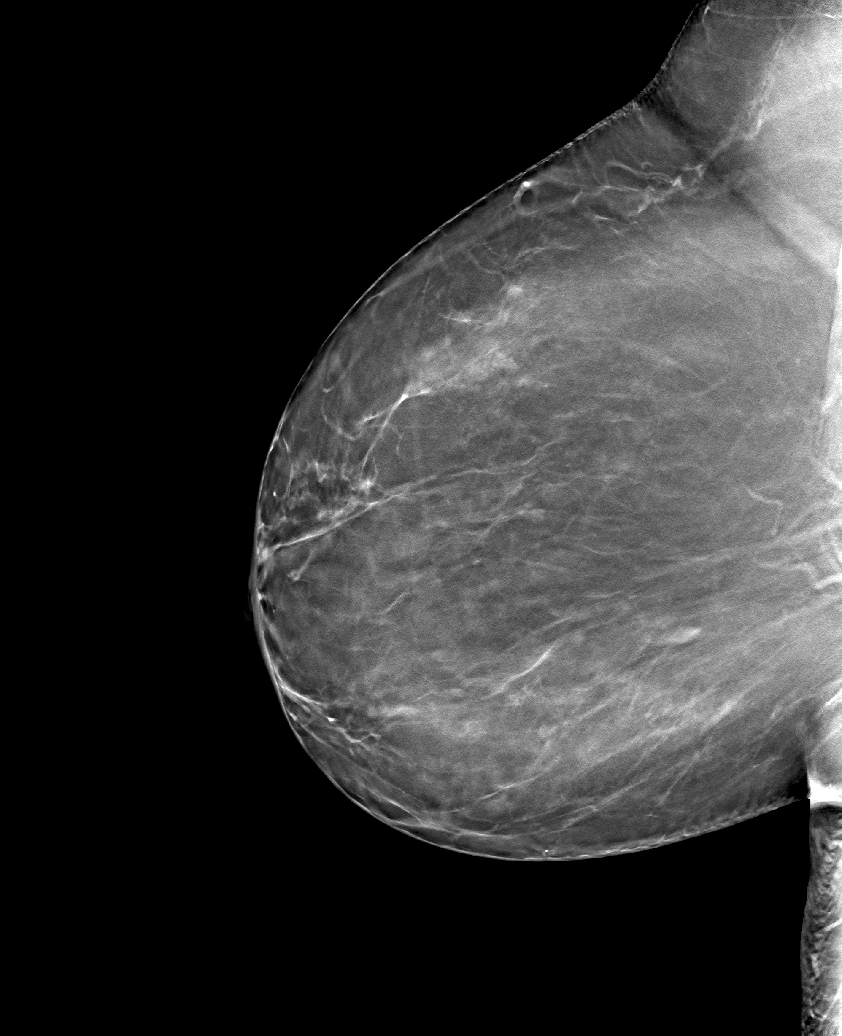

[6 of 30 positions shown; findings below may reference images not displayed]

ACR Breast Density Category b: There are scattered areas of
fibroglandular density.
FINDINGS: There are no findings suspicious for malignancy. Images were
processed with CAD.
IMPRESSION: No mammographic evidence of malignancy. A result letter of this
screening mammogram will be mailed directly to the patient.

RECOMMENDATION:
Screening mammogram in one year. (Code:[id number])

BI-RADS CATEGORY  1: Negative.

## 2021-10-23 ENCOUNTER — Other Ambulatory Visit: Payer: Self-pay | Admitting: Obstetrics & Gynecology

## 2021-10-23 DIAGNOSIS — Z1231 Encounter for screening mammogram for malignant neoplasm of breast: Secondary | ICD-10-CM

## 2021-11-09 ENCOUNTER — Other Ambulatory Visit: Payer: Self-pay | Admitting: Obstetrics & Gynecology

## 2021-12-01 ENCOUNTER — Other Ambulatory Visit: Payer: Self-pay

## 2021-12-01 ENCOUNTER — Ambulatory Visit
Admission: RE | Admit: 2021-12-01 | Discharge: 2021-12-01 | Disposition: A | Discharge status: Home / Self Care | Payer: 59 | Source: Ambulatory Visit | Attending: Obstetrics & Gynecology | Admitting: Obstetrics & Gynecology

## 2021-12-01 DIAGNOSIS — Z1231 Encounter for screening mammogram for malignant neoplasm of breast: Secondary | ICD-10-CM

## 2022-10-31 ENCOUNTER — Other Ambulatory Visit: Payer: Self-pay | Admitting: Obstetrics & Gynecology

## 2022-10-31 DIAGNOSIS — Z1231 Encounter for screening mammogram for malignant neoplasm of breast: Secondary | ICD-10-CM

## 2022-12-18 ENCOUNTER — Ambulatory Visit
Admission: RE | Admit: 2022-12-18 | Discharge: 2022-12-18 | Disposition: A | Discharge status: Home / Self Care | Payer: 59 | Source: Ambulatory Visit | Attending: Obstetrics & Gynecology | Admitting: Obstetrics & Gynecology

## 2022-12-18 DIAGNOSIS — Z1231 Encounter for screening mammogram for malignant neoplasm of breast: Secondary | ICD-10-CM

## 2022-12-20 ENCOUNTER — Other Ambulatory Visit: Payer: Self-pay | Admitting: Obstetrics & Gynecology

## 2022-12-20 DIAGNOSIS — R928 Other abnormal and inconclusive findings on diagnostic imaging of breast: Secondary | ICD-10-CM

## 2023-01-01 ENCOUNTER — Ambulatory Visit
Admission: RE | Admit: 2023-01-01 | Discharge: 2023-01-01 | Disposition: A | Discharge status: Home / Self Care | Payer: 59 | Source: Ambulatory Visit | Attending: Obstetrics & Gynecology | Admitting: Obstetrics & Gynecology

## 2023-01-01 DIAGNOSIS — R928 Other abnormal and inconclusive findings on diagnostic imaging of breast: Secondary | ICD-10-CM

## 2023-11-15 ENCOUNTER — Other Ambulatory Visit: Payer: Self-pay | Admitting: Obstetrics & Gynecology

## 2023-11-15 DIAGNOSIS — Z1231 Encounter for screening mammogram for malignant neoplasm of breast: Secondary | ICD-10-CM

## 2024-01-02 ENCOUNTER — Ambulatory Visit
Admission: RE | Admit: 2024-01-02 | Discharge: 2024-01-02 | Disposition: A | Discharge status: Home / Self Care | Payer: 59 | Source: Ambulatory Visit | Attending: Obstetrics & Gynecology | Admitting: Obstetrics & Gynecology

## 2024-01-02 DIAGNOSIS — Z1231 Encounter for screening mammogram for malignant neoplasm of breast: Secondary | ICD-10-CM
